# Patient Record
Sex: Female | Born: 1940 | Race: White | Hispanic: No | Smoking: Former smoker
Health system: Southern US, Community
[De-identification: ages and names within clinical notes are randomized; demographics above are authoritative.]

## PROBLEM LIST (undated history)

## (undated) DIAGNOSIS — R011 Cardiac murmur, unspecified: Secondary | ICD-10-CM

## (undated) DIAGNOSIS — I890 Lymphedema, not elsewhere classified: Secondary | ICD-10-CM

## (undated) DIAGNOSIS — C801 Malignant (primary) neoplasm, unspecified: Secondary | ICD-10-CM

## (undated) DIAGNOSIS — I509 Heart failure, unspecified: Secondary | ICD-10-CM

## (undated) DIAGNOSIS — I10 Essential (primary) hypertension: Secondary | ICD-10-CM

## (undated) DIAGNOSIS — K219 Gastro-esophageal reflux disease without esophagitis: Secondary | ICD-10-CM

## (undated) DIAGNOSIS — E119 Type 2 diabetes mellitus without complications: Secondary | ICD-10-CM

## (undated) DIAGNOSIS — M1990 Unspecified osteoarthritis, unspecified site: Secondary | ICD-10-CM

## (undated) HISTORY — DX: Malignant (primary) neoplasm, unspecified: C80.1

## (undated) HISTORY — DX: Cardiac murmur, unspecified: R01.1

## (undated) HISTORY — PX: OTHER SURGICAL HISTORY: SHX169

## (undated) HISTORY — DX: Gastro-esophageal reflux disease without esophagitis: K21.9

## (undated) HISTORY — DX: Unspecified osteoarthritis, unspecified site: M19.90

## (undated) HISTORY — DX: Essential (primary) hypertension: I10

## (undated) HISTORY — DX: Heart failure, unspecified: I50.9

## (undated) HISTORY — DX: Lymphedema, not elsewhere classified: I89.0

## (undated) HISTORY — DX: Type 2 diabetes mellitus without complications: E11.9

## (undated) HISTORY — PX: TONSILLECTOMY: SUR1361

---

## 1996-01-02 DIAGNOSIS — I890 Lymphedema, not elsewhere classified: Secondary | ICD-10-CM

## 1996-01-02 HISTORY — PX: BREAST LUMPECTOMY: SHX2

## 1996-01-02 HISTORY — DX: Lymphedema, not elsewhere classified: I89.0

## 2003-10-14 ENCOUNTER — Ambulatory Visit: Payer: Self-pay | Admitting: Oncology

## 2003-11-02 ENCOUNTER — Ambulatory Visit: Payer: Self-pay | Admitting: Oncology

## 2003-11-30 ENCOUNTER — Inpatient Hospital Stay: Payer: Self-pay | Admitting: Internal Medicine

## 2003-11-30 ENCOUNTER — Other Ambulatory Visit: Payer: Self-pay

## 2003-12-01 ENCOUNTER — Other Ambulatory Visit: Payer: Self-pay

## 2003-12-02 ENCOUNTER — Ambulatory Visit: Payer: Self-pay | Admitting: Oncology

## 2004-01-02 HISTORY — PX: COLONOSCOPY: SHX174

## 2004-02-14 ENCOUNTER — Ambulatory Visit: Payer: Self-pay

## 2004-03-01 ENCOUNTER — Ambulatory Visit: Payer: Self-pay

## 2004-03-22 ENCOUNTER — Inpatient Hospital Stay: Payer: Self-pay

## 2004-04-01 ENCOUNTER — Ambulatory Visit: Payer: Self-pay

## 2004-04-06 ENCOUNTER — Ambulatory Visit: Payer: Self-pay | Admitting: Oncology

## 2004-05-01 ENCOUNTER — Ambulatory Visit: Payer: Self-pay | Admitting: Oncology

## 2004-05-01 ENCOUNTER — Ambulatory Visit: Payer: Self-pay

## 2004-06-01 ENCOUNTER — Ambulatory Visit: Payer: Self-pay

## 2004-06-01 ENCOUNTER — Ambulatory Visit: Payer: Self-pay | Admitting: Oncology

## 2004-06-05 ENCOUNTER — Ambulatory Visit: Payer: Self-pay | Admitting: General Surgery

## 2004-07-01 ENCOUNTER — Ambulatory Visit: Payer: Self-pay

## 2004-08-11 ENCOUNTER — Emergency Department: Payer: Self-pay | Admitting: Emergency Medicine

## 2004-08-16 ENCOUNTER — Ambulatory Visit: Payer: Self-pay

## 2004-10-04 ENCOUNTER — Ambulatory Visit: Payer: Self-pay | Admitting: Oncology

## 2004-11-01 ENCOUNTER — Ambulatory Visit: Payer: Self-pay | Admitting: Oncology

## 2005-01-04 ENCOUNTER — Other Ambulatory Visit: Payer: Self-pay

## 2005-01-04 ENCOUNTER — Emergency Department: Payer: Self-pay | Admitting: Emergency Medicine

## 2005-02-19 ENCOUNTER — Ambulatory Visit: Payer: Self-pay | Admitting: *Deleted

## 2005-03-12 ENCOUNTER — Ambulatory Visit: Payer: Self-pay | Admitting: *Deleted

## 2005-04-02 ENCOUNTER — Ambulatory Visit: Payer: Self-pay | Admitting: Oncology

## 2005-05-01 ENCOUNTER — Ambulatory Visit: Payer: Self-pay | Admitting: Oncology

## 2005-06-04 ENCOUNTER — Ambulatory Visit: Payer: Self-pay

## 2005-06-06 ENCOUNTER — Ambulatory Visit: Payer: Self-pay | Admitting: General Surgery

## 2005-06-12 ENCOUNTER — Ambulatory Visit: Payer: Self-pay | Admitting: Family Medicine

## 2005-07-01 ENCOUNTER — Ambulatory Visit: Payer: Self-pay | Admitting: Family Medicine

## 2005-07-07 ENCOUNTER — Emergency Department: Payer: Self-pay | Admitting: Emergency Medicine

## 2005-07-16 ENCOUNTER — Ambulatory Visit: Payer: Self-pay | Admitting: Nephrology

## 2005-09-14 ENCOUNTER — Ambulatory Visit: Payer: Self-pay | Admitting: *Deleted

## 2005-09-25 ENCOUNTER — Ambulatory Visit: Payer: Self-pay | Admitting: *Deleted

## 2005-10-17 ENCOUNTER — Ambulatory Visit: Payer: Self-pay | Admitting: Oncology

## 2005-11-01 ENCOUNTER — Ambulatory Visit: Payer: Self-pay | Admitting: Oncology

## 2006-02-04 ENCOUNTER — Ambulatory Visit: Payer: Self-pay | Admitting: Unknown Physician Specialty

## 2006-04-02 ENCOUNTER — Ambulatory Visit: Payer: Self-pay | Admitting: Oncology

## 2006-04-23 ENCOUNTER — Ambulatory Visit: Payer: Self-pay | Admitting: Oncology

## 2006-05-02 ENCOUNTER — Ambulatory Visit: Payer: Self-pay | Admitting: Oncology

## 2006-06-10 ENCOUNTER — Ambulatory Visit: Payer: Self-pay | Admitting: General Surgery

## 2006-10-02 ENCOUNTER — Ambulatory Visit: Payer: Self-pay | Admitting: Oncology

## 2006-10-22 ENCOUNTER — Ambulatory Visit: Payer: Self-pay | Admitting: Oncology

## 2006-11-02 ENCOUNTER — Ambulatory Visit: Payer: Self-pay | Admitting: Oncology

## 2006-11-18 ENCOUNTER — Ambulatory Visit: Payer: Self-pay | Admitting: Family Medicine

## 2007-04-02 ENCOUNTER — Ambulatory Visit: Payer: Self-pay | Admitting: Oncology

## 2007-04-17 ENCOUNTER — Ambulatory Visit: Payer: Self-pay | Admitting: Oncology

## 2007-05-02 ENCOUNTER — Ambulatory Visit: Payer: Self-pay | Admitting: Oncology

## 2007-06-04 ENCOUNTER — Ambulatory Visit: Payer: Self-pay | Admitting: General Surgery

## 2007-07-02 ENCOUNTER — Ambulatory Visit: Payer: Self-pay | Admitting: Oncology

## 2007-07-21 ENCOUNTER — Ambulatory Visit: Payer: Self-pay | Admitting: Rheumatology

## 2007-08-13 ENCOUNTER — Encounter: Payer: Self-pay | Admitting: Family Medicine

## 2007-09-02 ENCOUNTER — Encounter: Payer: Self-pay | Admitting: Family Medicine

## 2007-10-02 ENCOUNTER — Ambulatory Visit: Payer: Self-pay | Admitting: Oncology

## 2007-11-02 ENCOUNTER — Ambulatory Visit: Payer: Self-pay | Admitting: Oncology

## 2007-11-25 ENCOUNTER — Ambulatory Visit: Payer: Self-pay | Admitting: Family Medicine

## 2008-02-15 ENCOUNTER — Inpatient Hospital Stay: Payer: Self-pay | Admitting: Internal Medicine

## 2008-03-08 ENCOUNTER — Ambulatory Visit: Payer: Self-pay | Admitting: Internal Medicine

## 2008-06-04 ENCOUNTER — Ambulatory Visit: Payer: Self-pay | Admitting: General Surgery

## 2008-10-01 ENCOUNTER — Ambulatory Visit: Payer: Self-pay | Admitting: Oncology

## 2008-10-15 ENCOUNTER — Ambulatory Visit: Payer: Self-pay | Admitting: Oncology

## 2008-11-01 ENCOUNTER — Ambulatory Visit: Payer: Self-pay | Admitting: Oncology

## 2009-01-16 ENCOUNTER — Ambulatory Visit: Payer: Self-pay | Admitting: Internal Medicine

## 2009-06-07 ENCOUNTER — Ambulatory Visit: Payer: Self-pay | Admitting: General Surgery

## 2009-07-29 ENCOUNTER — Ambulatory Visit: Payer: Self-pay | Admitting: Family Medicine

## 2009-09-15 ENCOUNTER — Ambulatory Visit: Payer: Self-pay | Admitting: Internal Medicine

## 2009-10-06 ENCOUNTER — Inpatient Hospital Stay: Payer: Self-pay | Admitting: Internal Medicine

## 2009-10-19 ENCOUNTER — Encounter: Payer: Self-pay | Admitting: Specialist

## 2009-10-23 ENCOUNTER — Inpatient Hospital Stay: Payer: Self-pay | Admitting: Internal Medicine

## 2009-10-26 ENCOUNTER — Ambulatory Visit: Payer: Self-pay | Admitting: Family

## 2009-10-31 ENCOUNTER — Encounter: Payer: Self-pay | Admitting: Specialist

## 2009-11-01 ENCOUNTER — Encounter: Payer: Self-pay | Admitting: Specialist

## 2009-11-02 ENCOUNTER — Ambulatory Visit: Payer: Self-pay | Admitting: Family Medicine

## 2009-11-03 ENCOUNTER — Emergency Department: Payer: Self-pay | Admitting: Emergency Medicine

## 2009-11-11 ENCOUNTER — Ambulatory Visit: Payer: Self-pay | Admitting: Oncology

## 2009-11-17 ENCOUNTER — Ambulatory Visit: Payer: Self-pay | Admitting: Family Medicine

## 2009-12-01 ENCOUNTER — Ambulatory Visit: Payer: Self-pay | Admitting: Oncology

## 2009-12-06 ENCOUNTER — Ambulatory Visit: Payer: Self-pay | Admitting: Family Medicine

## 2010-02-11 ENCOUNTER — Ambulatory Visit: Payer: Self-pay | Admitting: Internal Medicine

## 2010-02-14 ENCOUNTER — Inpatient Hospital Stay: Payer: Self-pay | Admitting: Internal Medicine

## 2010-02-23 ENCOUNTER — Encounter: Payer: Self-pay | Admitting: Internal Medicine

## 2010-03-02 ENCOUNTER — Encounter: Payer: Self-pay | Admitting: Internal Medicine

## 2010-03-02 ENCOUNTER — Ambulatory Visit: Payer: Self-pay | Admitting: Oncology

## 2010-03-02 ENCOUNTER — Inpatient Hospital Stay: Payer: Self-pay | Admitting: Internal Medicine

## 2010-03-06 LAB — CEA: CEA: 2.5 ng/mL (ref 0.0–4.7)

## 2010-03-06 LAB — CA 125: CA 125: 200.6 U/mL — ABNORMAL HIGH (ref 0.0–34.0)

## 2010-03-22 ENCOUNTER — Ambulatory Visit: Payer: Self-pay | Admitting: Oncology

## 2010-04-02 ENCOUNTER — Ambulatory Visit: Payer: Self-pay | Admitting: Oncology

## 2010-04-02 ENCOUNTER — Encounter: Payer: Self-pay | Admitting: Internal Medicine

## 2010-04-17 ENCOUNTER — Ambulatory Visit: Payer: Self-pay | Admitting: Family Medicine

## 2010-04-19 ENCOUNTER — Ambulatory Visit: Payer: Self-pay | Admitting: Oncology

## 2010-05-02 ENCOUNTER — Encounter: Payer: Self-pay | Admitting: Internal Medicine

## 2010-05-03 ENCOUNTER — Ambulatory Visit: Payer: Self-pay | Admitting: Oncology

## 2010-05-22 ENCOUNTER — Inpatient Hospital Stay: Payer: Self-pay | Admitting: Internal Medicine

## 2010-06-02 ENCOUNTER — Ambulatory Visit: Payer: Self-pay | Admitting: Oncology

## 2010-06-02 ENCOUNTER — Encounter: Payer: Self-pay | Admitting: Internal Medicine

## 2010-06-13 ENCOUNTER — Ambulatory Visit: Payer: Self-pay | Admitting: General Surgery

## 2010-07-02 ENCOUNTER — Ambulatory Visit: Payer: Self-pay | Admitting: Oncology

## 2010-07-02 ENCOUNTER — Encounter: Payer: Self-pay | Admitting: Internal Medicine

## 2010-08-02 ENCOUNTER — Encounter: Payer: Self-pay | Admitting: Internal Medicine

## 2010-08-02 ENCOUNTER — Ambulatory Visit: Payer: Self-pay | Admitting: Oncology

## 2010-08-08 ENCOUNTER — Ambulatory Visit: Payer: Self-pay | Admitting: Gynecologic Oncology

## 2010-08-29 ENCOUNTER — Ambulatory Visit: Payer: Self-pay | Admitting: Internal Medicine

## 2010-09-02 ENCOUNTER — Ambulatory Visit: Payer: Self-pay | Admitting: Oncology

## 2010-09-18 ENCOUNTER — Ambulatory Visit: Payer: Self-pay | Admitting: Family Medicine

## 2010-09-19 ENCOUNTER — Ambulatory Visit: Payer: Self-pay | Admitting: Gynecologic Oncology

## 2010-09-26 ENCOUNTER — Ambulatory Visit: Payer: Self-pay | Admitting: Gynecologic Oncology

## 2010-09-28 LAB — PATHOLOGY REPORT

## 2010-10-02 ENCOUNTER — Ambulatory Visit: Payer: Self-pay | Admitting: Oncology

## 2010-10-02 ENCOUNTER — Ambulatory Visit: Payer: Self-pay | Admitting: Family Medicine

## 2010-11-02 ENCOUNTER — Ambulatory Visit: Payer: Self-pay | Admitting: Family Medicine

## 2010-11-02 ENCOUNTER — Ambulatory Visit: Payer: Self-pay | Admitting: Oncology

## 2010-12-02 ENCOUNTER — Ambulatory Visit: Payer: Self-pay | Admitting: Family Medicine

## 2010-12-15 ENCOUNTER — Ambulatory Visit: Payer: Self-pay | Admitting: Oncology

## 2011-01-02 ENCOUNTER — Ambulatory Visit: Payer: Self-pay | Admitting: Oncology

## 2011-01-02 DIAGNOSIS — I509 Heart failure, unspecified: Secondary | ICD-10-CM

## 2011-01-02 HISTORY — DX: Heart failure, unspecified: I50.9

## 2011-01-02 HISTORY — PX: DILATION AND CURETTAGE OF UTERUS: SHX78

## 2011-06-14 ENCOUNTER — Ambulatory Visit: Payer: Self-pay | Admitting: General Surgery

## 2011-08-22 ENCOUNTER — Ambulatory Visit: Payer: Self-pay | Admitting: Ophthalmology

## 2011-10-24 ENCOUNTER — Ambulatory Visit: Payer: Self-pay | Admitting: Gastroenterology

## 2012-05-14 ENCOUNTER — Ambulatory Visit: Payer: Self-pay | Admitting: Family Medicine

## 2012-06-16 ENCOUNTER — Encounter: Payer: Self-pay | Admitting: General Surgery

## 2012-06-16 ENCOUNTER — Ambulatory Visit: Payer: Self-pay | Admitting: General Surgery

## 2012-07-03 ENCOUNTER — Encounter: Payer: Self-pay | Admitting: General Surgery

## 2012-07-03 ENCOUNTER — Ambulatory Visit (INDEPENDENT_AMBULATORY_CARE_PROVIDER_SITE_OTHER): Payer: Medicare Other | Admitting: General Surgery

## 2012-07-03 VITALS — BP 120/62 | HR 70 | Resp 14 | Ht 65.0 in | Wt 128.0 lb

## 2012-07-03 DIAGNOSIS — I890 Lymphedema, not elsewhere classified: Secondary | ICD-10-CM | POA: Insufficient documentation

## 2012-07-03 DIAGNOSIS — Z853 Personal history of malignant neoplasm of breast: Secondary | ICD-10-CM | POA: Insufficient documentation

## 2012-07-03 NOTE — Progress Notes (Signed)
Patient ID: Patricia Mccoy, female   DOB: 07/21/1940, 72 y.o.   MRN: [id number]  Chief Complaint  Patient presents with  . Follow-up    mammogram    HPI Patricia Mccoy is a 72 y.o. female.  who presents for a breast follow up with known history of breast cancer 1998. The most recent mammogram was done on 06-16-12.  Patient does perform regular self breast checks and gets regular mammograms done.  Wearing lymphedema sleeve to right arm.  HPI  Past Medical History  Diagnosis Date  . Hypertension   . Heart murmur   . Arthritis   . GERD (gastroesophageal reflux disease)   . Diabetes mellitus without complication     IDDM  . CHF (congestive heart failure) 2013  . Lymphedema of arm 1998    R arm  . Cancer     Right Breast    Past Surgical History  Procedure Laterality Date  . Other surgical history  1985, 1990, 2008    Ear Surgery  . Breast lumpectomy Right 1998    axillary dissection  . Tonsillectomy    . Colonoscopy  2006  . Dilation and curettage of uterus  2013    History reviewed. No pertinent family history.  Social History History  Substance Use Topics  . Smoking status: Former Games developer  . Smokeless tobacco: Never Used  . Alcohol Use: No    Allergies  Allergen Reactions  . Ambien (Zolpidem Tartrate)   . Codeine     Hallucinations   . Ivp Dye (Iodinated Diagnostic Agents)   . Keflex (Cephalexin)   . Maxzide (Hydrochlorothiazide W-Triamterene)   . Morphine And Related   . Penicillins   . Zestril (Lisinopril)   . Sulfa Antibiotics Rash    Current Outpatient Prescriptions  Medication Sig Dispense Refill  . amLODipine (NORVASC) 5 MG tablet 5 mg daily.       Marland Kitchen aspirin 81 MG tablet Take 81 mg by mouth daily.      Marland Kitchen atorvastatin (LIPITOR) 40 MG tablet Take 40 mg by mouth daily.       . digoxin (LANOXIN) 0.125 MG tablet Take 0.125 mg by mouth daily.       . ferrous sulfate 325 (65 FE) MG tablet Take 325 mg by mouth daily with breakfast.      .  furosemide (LASIX) 40 MG tablet 40 mg 2 (two) times daily.       Marland Kitchen LANTUS SOLOSTAR 100 UNIT/ML SOPN       . LINZESS 290 MCG CAPS       . losartan (COZAAR) 100 MG tablet Take 100 mg by mouth daily.       Marland Kitchen LOVAZA 1 G capsule Take 1 g by mouth 2 (two) times daily.       . methotrexate (RHEUMATREX) 2.5 MG tablet Take 2.5 mg by mouth 2 (two) times a week.       . metoprolol succinate (TOPROL-XL) 100 MG 24 hr tablet       . mirtazapine (REMERON) 15 MG tablet Take 15 mg by mouth at bedtime.       Marland Kitchen omeprazole (PRILOSEC) 20 MG capsule Take 20 mg by mouth daily.       . predniSONE (DELTASONE) 1 MG tablet Take 1 mg by mouth 2 (two) times daily.       Marland Kitchen telmisartan (MICARDIS) 80 MG tablet Take 40 mg by mouth daily.        No current facility-administered medications for  this visit.    Review of Systems Review of Systems  Constitutional: Negative.   Respiratory: Negative.   Cardiovascular: Negative.     Blood pressure 120/62, pulse 70, resp. rate 14, height 5\' 5"  (1.651 m), weight 128 lb (58.06 kg).  Physical Exam Physical Exam  Constitutional: She is oriented to person, place, and time. She appears well-developed and well-nourished.  Eyes: Conjunctivae are normal.  Neck: No mass and no thyromegaly present.  Cardiovascular: Normal rate and regular rhythm.   Pulses:      Radial pulses are 3+ on the right side, and 3+ on the left side.       Dorsalis pedis pulses are 0 on the right side, and 2+ on the left side.       Posterior tibial pulses are 1+ on the right side, and 1+ on the left side.  1 + mild edema bilateral ankles  Pulmonary/Chest: Effort normal and breath sounds normal. Right breast exhibits no inverted nipple, no mass, no nipple discharge, no skin change and no tenderness. Left breast exhibits no inverted nipple, no mass, no nipple discharge, no skin change and no tenderness.  Musculoskeletal:       Right wrist: She exhibits swelling.  Right arm lymphedema present   Lymphadenopathy:    She has no cervical adenopathy.    She has no axillary adenopathy.  Neurological: She is alert and oriented to person, place, and time.  Skin: Skin is warm and dry.    Data Reviewed Mammogram reviewed-stable  Assessment    Stable exam. Lymphedema right arm under control with compression sleeve.     Plan    1 yr f/u        [facility] G 07/03/2012, 1:02 PM

## 2012-07-03 NOTE — Patient Instructions (Addendum)
Continue self breast exams. Call office for any new breast issues or concerns. Monitor edema in lower legs, follow up with Dr Shelbie Ammons as scheduled

## 2012-08-22 ENCOUNTER — Ambulatory Visit: Payer: Self-pay | Admitting: Family Medicine

## 2013-03-30 ENCOUNTER — Ambulatory Visit: Payer: Self-pay | Admitting: Family Medicine

## 2013-07-14 ENCOUNTER — Encounter: Payer: Self-pay | Admitting: General Surgery

## 2013-07-22 ENCOUNTER — Ambulatory Visit: Payer: Medicare Other | Admitting: General Surgery

## 2013-07-23 ENCOUNTER — Telehealth: Payer: Self-pay | Admitting: General Surgery

## 2013-07-23 NOTE — Telephone Encounter (Signed)
07-23-13 @ 1:59PM L/M FOR PT TO CALL & LET UP KNOW IF SHE CAN COME IN ON MON 07-27-13 @ 9:30AM. SHE CURRENTLY HAS AN APPT FOR 07-27-13 @ 2:30PM/MTH

## 2013-07-27 ENCOUNTER — Ambulatory Visit (INDEPENDENT_AMBULATORY_CARE_PROVIDER_SITE_OTHER): Payer: Medicare HMO | Admitting: General Surgery

## 2013-07-27 ENCOUNTER — Encounter: Payer: Self-pay | Admitting: General Surgery

## 2013-07-27 VITALS — BP 120/72 | HR 70 | Resp 14 | Ht 65.0 in | Wt 118.0 lb

## 2013-07-27 DIAGNOSIS — Z853 Personal history of malignant neoplasm of breast: Secondary | ICD-10-CM

## 2013-07-27 NOTE — Progress Notes (Signed)
Patient ID: Patricia Mccoy, female   DOB: Dec 09, 1940, 73 y.o.   MRN: [id number]  Chief Complaint  Patient presents with  . Follow-up    mammogram    HPI Patricia Mccoy is a 73 y.o. female who presents for a breast evaluation. The most recent mammogram was done on 07/10/13 at [facility].   Patient does perform regular self breast checks and gets regular mammograms done.    HPI  Past Medical History  Diagnosis Date  . Hypertension   . Heart murmur   . Arthritis   . GERD (gastroesophageal reflux disease)   . Diabetes mellitus without complication     IDDM  . CHF (congestive heart failure) 2013  . Lymphedema of arm 1998    R arm  . Cancer     Right Breast    Past Surgical History  Procedure Laterality Date  . Other surgical history  1985, 1990, 2008    Ear Surgery  . Breast lumpectomy Right 1998    axillary dissection  . Tonsillectomy    . Colonoscopy  2006  . Dilation and curettage of uterus  2013    Family History  Problem Relation Age of Onset  . Brain cancer Brother     2 brothers    Social History History  Substance Use Topics  . Smoking status: Former Research scientist (life sciences)  . Smokeless tobacco: Never Used  . Alcohol Use: No    Allergies  Allergen Reactions  . Penicillins Anaphylaxis    Other reaction(s): UNKNOWN  . Ace Inhibitors     Other reaction(s): UNKNOWN  . Ambien [Zolpidem Tartrate]   . Codeine Nausea And Vomiting    Other reaction(s): NAUSEA Hallucinations   . Dexamethasone     Other reaction(s): UNKNOWN  . Ivp Dye [Iodinated Diagnostic Agents]   . Keflex [Cephalexin] Nausea And Vomiting    Other reaction(s): UNKNOWN  . Maxzide [Hydrochlorothiazide W-Triamterene]   . Morphine     Other reaction(s): OTHER  . Morphine And Related   . Zestril [Lisinopril] Nausea And Vomiting  . Zolpidem     Other reaction(s): OTHER  . Sulfa Antibiotics Rash    Other reaction(s): UNKNOWN    Current Outpatient Prescriptions  Medication Sig Dispense Refill  .  amLODipine (NORVASC) 5 MG tablet 5 mg daily.       Marland Kitchen aspirin 81 MG tablet Take 81 mg by mouth daily.      Marland Kitchen atorvastatin (LIPITOR) 40 MG tablet Take 40 mg by mouth daily.       . digoxin (LANOXIN) 0.125 MG tablet Take 0.125 mg by mouth daily.       . ferrous sulfate 325 (65 FE) MG tablet Take 325 mg by mouth daily with breakfast.      . Fish Oil-Cholecalciferol (FISH OIL + D3 PO) Take 1 tablet by mouth.      . furosemide (LASIX) 40 MG tablet 40 mg 2 (two) times daily.       . insulin aspart (NOVOLOG) 100 UNIT/ML injection Inject into the skin 3 (three) times daily before meals.      Marland Kitchen LANTUS SOLOSTAR 100 UNIT/ML SOPN       . LINZESS 290 MCG CAPS       . losartan (COZAAR) 100 MG tablet Take 100 mg by mouth daily.       Marland Kitchen LOVAZA 1 G capsule Take 1 g by mouth 2 (two) times daily.       . methotrexate (RHEUMATREX) 2.5 MG tablet Take  2.5 mg by mouth 2 (two) times a week.       . metoprolol succinate (TOPROL-XL) 100 MG 24 hr tablet       . mirtazapine (REMERON) 15 MG tablet Take 15 mg by mouth at bedtime.       Marland Kitchen omeprazole (PRILOSEC) 20 MG capsule Take 20 mg by mouth daily.       . predniSONE (DELTASONE) 1 MG tablet Take 1 mg by mouth 2 (two) times daily.       Marland Kitchen telmisartan (MICARDIS) 80 MG tablet Take 40 mg by mouth daily.        No current facility-administered medications for this visit.    Review of Systems Review of Systems  Constitutional: Negative.   Respiratory: Negative.   Cardiovascular: Negative.     Blood pressure 120/72, pulse 70, resp. rate 14, height 5\' 5"  (1.651 m), weight 118 lb (53.524 kg).  Physical Exam Physical Exam  Constitutional: She is oriented to person, place, and time. She appears well-developed and well-nourished.  Eyes: Conjunctivae are normal. No scleral icterus.  Neck: Neck supple.  Cardiovascular: Normal rate and regular rhythm.   Murmur heard.  Systolic murmur is present with a grade of 4/6  Murmur is best heard over the precordium    Pulmonary/Chest: Effort normal and breath sounds normal. Right breast exhibits no inverted nipple, no mass, no nipple discharge, no skin change and no tenderness. Left breast exhibits no inverted nipple, no mass, no nipple discharge, no skin change and no tenderness.  Abdominal: Soft. Bowel sounds are normal. There is no tenderness.  Lymphadenopathy:    She has no cervical adenopathy.    She has no axillary adenopathy.  Neurological: She is alert and oriented to person, place, and time.  Skin: Skin is warm and dry.  Right arm lymphedema present and stable  Data Reviewed Mammogram reviewed. Stable  Assessment    Stable exam. Lymphedema right arm under control with compression sleeve. History of breast cancer. No signs of recurrence       Plan    Patient to return in one year bilateral screening mammogram.         Junie Panning G 07/27/2013, 12:46 PM

## 2013-07-27 NOTE — Patient Instructions (Addendum)
Patient to return in one year bilateral screening mammogram.  Continue self breast exams. Call office for any new breast issues or concerns.

## 2013-08-14 ENCOUNTER — Ambulatory Visit: Payer: Self-pay | Admitting: Gastroenterology

## 2013-08-19 LAB — PATHOLOGY REPORT

## 2013-08-23 ENCOUNTER — Ambulatory Visit: Payer: Self-pay

## 2013-08-30 ENCOUNTER — Inpatient Hospital Stay: Payer: Self-pay | Admitting: Internal Medicine

## 2013-08-30 LAB — CBC WITH DIFFERENTIAL/PLATELET
Basophil #: 0.2 10*3/uL — ABNORMAL HIGH (ref 0.0–0.1)
Basophil %: 2.4 %
EOS ABS: 0.3 10*3/uL (ref 0.0–0.7)
EOS PCT: 3.4 %
HCT: 40 % (ref 35.0–47.0)
HGB: 12.9 g/dL (ref 12.0–16.0)
Lymphocyte #: 1 10*3/uL (ref 1.0–3.6)
Lymphocyte %: 11.4 %
MCH: 31.7 pg (ref 26.0–34.0)
MCHC: 32.3 g/dL (ref 32.0–36.0)
MCV: 98 fL (ref 80–100)
MONO ABS: 0.5 x10 3/mm (ref 0.2–0.9)
MONOS PCT: 5.8 %
NEUTROS PCT: 77 %
Neutrophil #: 6.6 10*3/uL — ABNORMAL HIGH (ref 1.4–6.5)
PLATELETS: 344 10*3/uL (ref 150–440)
RBC: 4.07 10*6/uL (ref 3.80–5.20)
RDW: 14.8 % — ABNORMAL HIGH (ref 11.5–14.5)
WBC: 8.5 10*3/uL (ref 3.6–11.0)

## 2013-08-30 LAB — COMPREHENSIVE METABOLIC PANEL
ALK PHOS: 179 U/L — AB
ALT: 23 U/L
Albumin: 3.5 g/dL (ref 3.4–5.0)
Anion Gap: 9 (ref 7–16)
BILIRUBIN TOTAL: 0.6 mg/dL (ref 0.2–1.0)
BUN: 27 mg/dL — AB (ref 7–18)
CHLORIDE: 98 mmol/L (ref 98–107)
CO2: 29 mmol/L (ref 21–32)
Calcium, Total: 9.3 mg/dL (ref 8.5–10.1)
Creatinine: 1.98 mg/dL — ABNORMAL HIGH (ref 0.60–1.30)
EGFR (African American): 28 — ABNORMAL LOW
GFR CALC NON AF AMER: 24 — AB
Glucose: 224 mg/dL — ABNORMAL HIGH (ref 65–99)
OSMOLALITY: 284 (ref 275–301)
Potassium: 3.7 mmol/L (ref 3.5–5.1)
SGOT(AST): 37 U/L (ref 15–37)
SODIUM: 136 mmol/L (ref 136–145)
Total Protein: 7.9 g/dL (ref 6.4–8.2)

## 2013-08-30 LAB — CK-MB
CK-MB: 2.4 ng/mL (ref 0.5–3.6)
CK-MB: 2.1 ng/mL (ref 0.5–3.6)
CK-MB: 1.8 ng/mL (ref 0.5–3.6)

## 2013-08-30 LAB — TROPONIN I
TROPONIN-I: 1.7 ng/mL — AB
Troponin-I: 2 ng/mL — ABNORMAL HIGH
Troponin-I: 1.4 ng/mL — ABNORMAL HIGH

## 2013-08-30 LAB — PRO B NATRIURETIC PEPTIDE: B-Type Natriuretic Peptide: 16918 pg/mL — ABNORMAL HIGH (ref 0–125)

## 2013-08-31 LAB — CBC WITH DIFFERENTIAL/PLATELET
Basophil #: 0 10*3/uL (ref 0.0–0.1)
Basophil %: 0.6 %
Eosinophil #: 0.2 10*3/uL (ref 0.0–0.7)
Eosinophil %: 4.6 %
HCT: 30.7 % — ABNORMAL LOW (ref 35.0–47.0)
HGB: 10 g/dL — AB (ref 12.0–16.0)
Lymphocyte #: 1.4 10*3/uL (ref 1.0–3.6)
Lymphocyte %: 26.9 %
MCH: 31.6 pg (ref 26.0–34.0)
MCHC: 32.6 g/dL (ref 32.0–36.0)
MCV: 97 fL (ref 80–100)
Monocyte #: 0.2 x10 3/mm (ref 0.2–0.9)
Monocyte %: 3.6 %
NEUTROS ABS: 3.4 10*3/uL (ref 1.4–6.5)
NEUTROS PCT: 64.3 %
PLATELETS: 252 10*3/uL (ref 150–440)
RBC: 3.17 10*6/uL — AB (ref 3.80–5.20)
RDW: 14.7 % — AB (ref 11.5–14.5)
WBC: 5.2 10*3/uL (ref 3.6–11.0)

## 2013-08-31 LAB — COMPREHENSIVE METABOLIC PANEL
ALBUMIN: 2.4 g/dL — AB (ref 3.4–5.0)
ALK PHOS: 106 U/L
Anion Gap: 9 (ref 7–16)
BUN: 39 mg/dL — ABNORMAL HIGH (ref 7–18)
Bilirubin,Total: 0.6 mg/dL (ref 0.2–1.0)
CHLORIDE: 99 mmol/L (ref 98–107)
CREATININE: 1.98 mg/dL — AB (ref 0.60–1.30)
Calcium, Total: 8.3 mg/dL — ABNORMAL LOW (ref 8.5–10.1)
Co2: 30 mmol/L (ref 21–32)
EGFR (Non-African Amer.): 24 — ABNORMAL LOW
GFR CALC AF AMER: 28 — AB
Glucose: 151 mg/dL — ABNORMAL HIGH (ref 65–99)
OSMOLALITY: 288 (ref 275–301)
Potassium: 3.7 mmol/L (ref 3.5–5.1)
SGOT(AST): 23 U/L (ref 15–37)
SGPT (ALT): 16 U/L
Sodium: 138 mmol/L (ref 136–145)
Total Protein: 5.5 g/dL — ABNORMAL LOW (ref 6.4–8.2)

## 2013-08-31 LAB — LIPID PANEL
CHOLESTEROL: 115 mg/dL (ref 0–200)
HDL Cholesterol: 47 mg/dL (ref 40–60)
Ldl Cholesterol, Calc: 46 mg/dL (ref 0–100)
TRIGLYCERIDES: 109 mg/dL (ref 0–200)
VLDL CHOLESTEROL, CALC: 22 mg/dL (ref 5–40)

## 2013-08-31 LAB — PRO B NATRIURETIC PEPTIDE: B-Type Natriuretic Peptide: 23242 pg/mL — ABNORMAL HIGH (ref 0–125)

## 2013-09-05 ENCOUNTER — Observation Stay: Payer: Self-pay | Admitting: Specialist

## 2013-09-05 ENCOUNTER — Ambulatory Visit: Payer: Self-pay | Admitting: Physician Assistant

## 2013-09-05 LAB — CBC WITH DIFFERENTIAL/PLATELET
BASOS PCT: 0.4 %
Basophil #: 0 10*3/uL (ref 0.0–0.1)
Eosinophil #: 0.1 10*3/uL (ref 0.0–0.7)
Eosinophil %: 0.8 %
HCT: 36.8 % (ref 35.0–47.0)
HGB: 11.9 g/dL — AB (ref 12.0–16.0)
Lymphocyte #: 0.6 10*3/uL — ABNORMAL LOW (ref 1.0–3.6)
Lymphocyte %: 5.6 %
MCH: 31.9 pg (ref 26.0–34.0)
MCHC: 32.3 g/dL (ref 32.0–36.0)
MCV: 99 fL (ref 80–100)
Monocyte #: 0.5 x10 3/mm (ref 0.2–0.9)
Monocyte %: 4.7 %
NEUTROS ABS: 9.5 10*3/uL — AB (ref 1.4–6.5)
NEUTROS PCT: 88.5 %
PLATELETS: 303 10*3/uL (ref 150–440)
RBC: 3.73 10*6/uL — AB (ref 3.80–5.20)
RDW: 15.4 % — ABNORMAL HIGH (ref 11.5–14.5)
WBC: 10.8 10*3/uL (ref 3.6–11.0)

## 2013-09-05 LAB — BASIC METABOLIC PANEL
Anion Gap: 9 (ref 7–16)
BUN: 38 mg/dL — AB (ref 7–18)
Calcium, Total: 9.1 mg/dL (ref 8.5–10.1)
Chloride: 94 mmol/L — ABNORMAL LOW (ref 98–107)
Co2: 28 mmol/L (ref 21–32)
Creatinine: 2.32 mg/dL — ABNORMAL HIGH (ref 0.60–1.30)
EGFR (Non-African Amer.): 20 — ABNORMAL LOW
GFR CALC AF AMER: 23 — AB
Glucose: 304 mg/dL — ABNORMAL HIGH (ref 65–99)
OSMOLALITY: 283 (ref 275–301)
Potassium: 4.4 mmol/L (ref 3.5–5.1)
SODIUM: 131 mmol/L — AB (ref 136–145)

## 2013-09-05 LAB — PROTIME-INR
INR: 0.9
PROTHROMBIN TIME: 12.3 s (ref 11.5–14.7)

## 2013-09-06 LAB — CBC WITH DIFFERENTIAL/PLATELET
BASOS ABS: 0 10*3/uL (ref 0.0–0.1)
BASOS PCT: 0.6 %
EOS PCT: 5 %
Eosinophil #: 0.4 10*3/uL (ref 0.0–0.7)
HCT: 36.5 % (ref 35.0–47.0)
HGB: 12.3 g/dL (ref 12.0–16.0)
LYMPHS ABS: 2 10*3/uL (ref 1.0–3.6)
LYMPHS PCT: 23.9 %
MCH: 32.7 pg (ref 26.0–34.0)
MCHC: 33.8 g/dL (ref 32.0–36.0)
MCV: 97 fL (ref 80–100)
MONOS PCT: 6.4 %
Monocyte #: 0.5 x10 3/mm (ref 0.2–0.9)
NEUTROS ABS: 5.4 10*3/uL (ref 1.4–6.5)
Neutrophil %: 64.1 %
PLATELETS: 308 10*3/uL (ref 150–440)
RBC: 3.77 10*6/uL — AB (ref 3.80–5.20)
RDW: 15.3 % — ABNORMAL HIGH (ref 11.5–14.5)
WBC: 8.4 10*3/uL (ref 3.6–11.0)

## 2013-09-06 LAB — BASIC METABOLIC PANEL
Anion Gap: 7 (ref 7–16)
BUN: 38 mg/dL — ABNORMAL HIGH (ref 7–18)
CALCIUM: 8.8 mg/dL (ref 8.5–10.1)
Chloride: 98 mmol/L (ref 98–107)
Co2: 32 mmol/L (ref 21–32)
Creatinine: 2.11 mg/dL — ABNORMAL HIGH (ref 0.60–1.30)
EGFR (African American): 26 — ABNORMAL LOW
GFR CALC NON AF AMER: 22 — AB
Glucose: 45 mg/dL — ABNORMAL LOW (ref 65–99)
Osmolality: 280 (ref 275–301)
Potassium: 3.6 mmol/L (ref 3.5–5.1)
Sodium: 137 mmol/L (ref 136–145)

## 2013-10-01 ENCOUNTER — Inpatient Hospital Stay: Payer: Self-pay | Admitting: Internal Medicine

## 2013-10-01 LAB — COMPREHENSIVE METABOLIC PANEL
ALBUMIN: 3.1 g/dL — AB (ref 3.4–5.0)
ALT: 31 U/L
ANION GAP: 7 (ref 7–16)
AST: 36 U/L (ref 15–37)
Alkaline Phosphatase: 190 U/L — ABNORMAL HIGH
BILIRUBIN TOTAL: 0.5 mg/dL (ref 0.2–1.0)
BUN: 26 mg/dL — ABNORMAL HIGH (ref 7–18)
CHLORIDE: 94 mmol/L — AB (ref 98–107)
CREATININE: 1.85 mg/dL — AB (ref 0.60–1.30)
Calcium, Total: 8.1 mg/dL — ABNORMAL LOW (ref 8.5–10.1)
Co2: 28 mmol/L (ref 21–32)
EGFR (African American): 34 — ABNORMAL LOW
EGFR (Non-African Amer.): 28 — ABNORMAL LOW
Glucose: 341 mg/dL — ABNORMAL HIGH (ref 65–99)
OSMOLALITY: 277 (ref 275–301)
POTASSIUM: 3.3 mmol/L — AB (ref 3.5–5.1)
SODIUM: 129 mmol/L — AB (ref 136–145)
Total Protein: 6.7 g/dL (ref 6.4–8.2)

## 2013-10-01 LAB — CBC WITH DIFFERENTIAL/PLATELET
Basophil #: 0.1 10*3/uL (ref 0.0–0.1)
Basophil %: 0.8 %
Eosinophil #: 0.3 10*3/uL (ref 0.0–0.7)
Eosinophil %: 4.8 %
HCT: 33.5 % — ABNORMAL LOW (ref 35.0–47.0)
HGB: 11.1 g/dL — AB (ref 12.0–16.0)
LYMPHS ABS: 1.3 10*3/uL (ref 1.0–3.6)
LYMPHS PCT: 17.5 %
MCH: 32.3 pg (ref 26.0–34.0)
MCHC: 33.2 g/dL (ref 32.0–36.0)
MCV: 97 fL (ref 80–100)
Monocyte #: 0.5 x10 3/mm (ref 0.2–0.9)
Monocyte %: 7.5 %
NEUTROS ABS: 5.1 10*3/uL (ref 1.4–6.5)
NEUTROS PCT: 69.4 %
Platelet: 309 10*3/uL (ref 150–440)
RBC: 3.45 10*6/uL — ABNORMAL LOW (ref 3.80–5.20)
RDW: 14.5 % (ref 11.5–14.5)
WBC: 7.3 10*3/uL (ref 3.6–11.0)

## 2013-10-01 LAB — URINALYSIS, COMPLETE
BLOOD: NEGATIVE
Bacteria: NONE SEEN
Bilirubin,UR: NEGATIVE
Glucose,UR: >=500 mg/dL (ref 0–75)
Ketone: NEGATIVE
LEUKOCYTE ESTERASE: NEGATIVE
NITRITE: NEGATIVE
PH: 6 (ref 4.5–8.0)
Protein: NEGATIVE
RBC,UR: 1 /HPF (ref 0–5)
Specific Gravity: 1.002 (ref 1.003–1.030)
Squamous Epithelial: <1
WBC UR: <1 /HPF (ref 0–5)

## 2013-10-01 LAB — CK-MB
CK-MB: 2.1 ng/mL (ref 0.5–3.6)
CK-MB: 2 ng/mL (ref 0.5–3.6)

## 2013-10-01 LAB — ALBUMIN, FLUID (OTHER): Body Fluid Albumin: 1.5 g/dL

## 2013-10-01 LAB — AMYLASE, BODY FLUID: AMYLASE, BODY FLUID: 27 U/L

## 2013-10-01 LAB — PROTEIN, BODY FLUID: Protein, Body Fluid: 2.2 g/dL

## 2013-10-01 LAB — TROPONIN I
TROPONIN-I: 0.1 ng/mL — AB
TROPONIN-I: 0.12 ng/mL — AB
TROPONIN-I: 0.09 ng/mL — AB
Troponin-I: 0.03 ng/mL
Troponin-I: 0.12 ng/mL — ABNORMAL HIGH

## 2013-10-01 LAB — LACTATE DEHYDROGENASE, PLEURAL OR PERITONEAL FLUID: LDH, BODY FLUID: 75 U/L

## 2013-10-01 LAB — GLUCOSE, SEROUS FLUID: Glucose, Body Fluid: 175 mg/dL

## 2013-10-01 LAB — CK TOTAL AND CKMB (NOT AT ARMC)
CK, TOTAL: 71 U/L
CK-MB: 1.7 ng/mL (ref 0.5–3.6)

## 2013-10-01 LAB — PRO B NATRIURETIC PEPTIDE: B-Type Natriuretic Peptide: 8203 pg/mL — ABNORMAL HIGH (ref 0–125)

## 2013-10-02 LAB — BASIC METABOLIC PANEL
Anion Gap: 8 (ref 7–16)
BUN: 22 mg/dL — AB (ref 7–18)
CO2: 29 mmol/L (ref 21–32)
Calcium, Total: 8.5 mg/dL (ref 8.5–10.1)
Chloride: 98 mmol/L (ref 98–107)
Creatinine: 1.67 mg/dL — ABNORMAL HIGH (ref 0.60–1.30)
EGFR (African American): 39 — ABNORMAL LOW
EGFR (Non-African Amer.): 32 — ABNORMAL LOW
GLUCOSE: 258 mg/dL — AB (ref 65–99)
Osmolality: 282 (ref 275–301)
POTASSIUM: 3.6 mmol/L (ref 3.5–5.1)
SODIUM: 135 mmol/L — AB (ref 136–145)

## 2013-10-03 LAB — BASIC METABOLIC PANEL
Anion Gap: 9 (ref 7–16)
BUN: 33 mg/dL — AB (ref 7–18)
CALCIUM: 7.7 mg/dL — AB (ref 8.5–10.1)
Chloride: 95 mmol/L — ABNORMAL LOW (ref 98–107)
Co2: 27 mmol/L (ref 21–32)
Creatinine: 1.98 mg/dL — ABNORMAL HIGH (ref 0.60–1.30)
Glucose: 317 mg/dL — ABNORMAL HIGH (ref 65–99)
OSMOLALITY: 282 (ref 275–301)
Potassium: 3.8 mmol/L (ref 3.5–5.1)
Sodium: 131 mmol/L — ABNORMAL LOW (ref 136–145)

## 2013-10-03 LAB — CBC WITH DIFFERENTIAL/PLATELET
Basophil #: 0.1 10*3/uL (ref 0.0–0.1)
Basophil %: 0.8 %
Eosinophil #: 0.2 10*3/uL (ref 0.0–0.7)
Eosinophil %: 3 %
HCT: 29.2 % — ABNORMAL LOW (ref 35.0–47.0)
HGB: 9.8 g/dL — AB (ref 12.0–16.0)
Lymphocyte #: 1.2 10*3/uL (ref 1.0–3.6)
Lymphocyte %: 16.9 %
MCH: 32.6 pg (ref 26.0–34.0)
MCHC: 33.6 g/dL (ref 32.0–36.0)
MCV: 97 fL (ref 80–100)
MONOS PCT: 8.4 %
Monocyte #: 0.6 x10 3/mm (ref 0.2–0.9)
Neutrophil #: 4.9 10*3/uL (ref 1.4–6.5)
Neutrophil %: 70.9 %
PLATELETS: 261 10*3/uL (ref 150–440)
RBC: 3.01 10*6/uL — AB (ref 3.80–5.20)
RDW: 14.5 % (ref 11.5–14.5)
WBC: 6.9 10*3/uL (ref 3.6–11.0)

## 2013-11-02 ENCOUNTER — Encounter: Payer: Self-pay | Admitting: General Surgery

## 2013-11-18 ENCOUNTER — Inpatient Hospital Stay: Payer: Self-pay | Admitting: Internal Medicine

## 2013-11-18 ENCOUNTER — Ambulatory Visit: Payer: Self-pay | Admitting: Family Medicine

## 2013-11-18 LAB — COMPREHENSIVE METABOLIC PANEL
ALT: 27 U/L
Albumin: 3.6 g/dL (ref 3.4–5.0)
Alkaline Phosphatase: 129 U/L — ABNORMAL HIGH
Anion Gap: 9 (ref 7–16)
BILIRUBIN TOTAL: 1 mg/dL (ref 0.2–1.0)
BUN: 34 mg/dL — ABNORMAL HIGH (ref 7–18)
CHLORIDE: 94 mmol/L — AB (ref 98–107)
CO2: 28 mmol/L (ref 21–32)
Calcium, Total: 9.4 mg/dL (ref 8.5–10.1)
Creatinine: 1.88 mg/dL — ABNORMAL HIGH (ref 0.60–1.30)
GLUCOSE: 288 mg/dL — AB (ref 65–99)
Osmolality: 281 (ref 275–301)
Potassium: 3.6 mmol/L (ref 3.5–5.1)
SGOT(AST): 23 U/L (ref 15–37)
Sodium: 131 mmol/L — ABNORMAL LOW (ref 136–145)
Total Protein: 7.2 g/dL (ref 6.4–8.2)

## 2013-11-18 LAB — CBC WITH DIFFERENTIAL/PLATELET
BASOS ABS: 0 10*3/uL (ref 0.0–0.1)
BASOS PCT: 0.3 %
EOS PCT: 1.1 %
Eosinophil #: 0.1 10*3/uL (ref 0.0–0.7)
HCT: 36.4 % (ref 35.0–47.0)
HGB: 11.9 g/dL — AB (ref 12.0–16.0)
LYMPHS PCT: 12 %
Lymphocyte #: 1 10*3/uL (ref 1.0–3.6)
MCH: 31.8 pg (ref 26.0–34.0)
MCHC: 32.7 g/dL (ref 32.0–36.0)
MCV: 97 fL (ref 80–100)
MONOS PCT: 7.3 %
Monocyte #: 0.6 x10 3/mm (ref 0.2–0.9)
NEUTROS ABS: 6.4 10*3/uL (ref 1.4–6.5)
Neutrophil %: 79.3 %
Platelet: 315 10*3/uL (ref 150–440)
RBC: 3.74 10*6/uL — ABNORMAL LOW (ref 3.80–5.20)
RDW: 15.2 % — ABNORMAL HIGH (ref 11.5–14.5)
WBC: 8.1 10*3/uL (ref 3.6–11.0)

## 2013-11-18 LAB — TROPONIN I
TROPONIN-I: 0.02 ng/mL
Troponin-I: 0.02 ng/mL

## 2013-11-18 LAB — CK TOTAL AND CKMB (NOT AT ARMC)
CK, TOTAL: 37 U/L
CK, Total: 48 U/L
CK, Total: 35 U/L
CK-MB: 1.4 ng/mL (ref 0.5–3.6)
CK-MB: 1.4 ng/mL (ref 0.5–3.6)
CK-MB: 0.9 ng/mL (ref 0.5–3.6)

## 2013-11-18 LAB — PROTIME-INR
INR: 0.9
Prothrombin Time: 12.2 secs (ref 11.5–14.7)

## 2013-11-18 LAB — PRO B NATRIURETIC PEPTIDE: B-TYPE NATIURETIC PEPTID: 8382 pg/mL — AB (ref 0–450)

## 2013-11-19 LAB — BASIC METABOLIC PANEL
ANION GAP: 5 — AB (ref 7–16)
BUN: 37 mg/dL — AB (ref 7–18)
CO2: 32 mmol/L (ref 21–32)
CREATININE: 1.89 mg/dL — AB (ref 0.60–1.30)
Calcium, Total: 8 mg/dL — ABNORMAL LOW (ref 8.5–10.1)
Chloride: 100 mmol/L (ref 98–107)
EGFR (African American): 33 — ABNORMAL LOW
GFR CALC NON AF AMER: 28 — AB
GLUCOSE: 150 mg/dL — AB (ref 65–99)
OSMOLALITY: 285 (ref 275–301)
POTASSIUM: 3.2 mmol/L — AB (ref 3.5–5.1)
Sodium: 137 mmol/L (ref 136–145)

## 2013-11-19 LAB — CBC WITH DIFFERENTIAL/PLATELET
Basophil #: 0 10*3/uL (ref 0.0–0.1)
Basophil %: 0.7 %
Eosinophil #: 0.2 10*3/uL (ref 0.0–0.7)
Eosinophil %: 3.7 %
HCT: 30.1 % — AB (ref 35.0–47.0)
HGB: 9.9 g/dL — AB (ref 12.0–16.0)
Lymphocyte #: 1.3 10*3/uL (ref 1.0–3.6)
Lymphocyte %: 22.4 %
MCH: 31.9 pg (ref 26.0–34.0)
MCHC: 32.8 g/dL (ref 32.0–36.0)
MCV: 97 fL (ref 80–100)
MONOS PCT: 9.2 %
Monocyte #: 0.5 x10 3/mm (ref 0.2–0.9)
Neutrophil #: 3.6 10*3/uL (ref 1.4–6.5)
Neutrophil %: 64 %
PLATELETS: 255 10*3/uL (ref 150–440)
RBC: 3.1 10*6/uL — ABNORMAL LOW (ref 3.80–5.20)
RDW: 15.6 % — AB (ref 11.5–14.5)
WBC: 5.6 10*3/uL (ref 3.6–11.0)

## 2013-11-20 LAB — BASIC METABOLIC PANEL
Anion Gap: 6 — ABNORMAL LOW (ref 7–16)
BUN: 43 mg/dL — ABNORMAL HIGH (ref 7–18)
CO2: 31 mmol/L (ref 21–32)
CREATININE: 2 mg/dL — AB (ref 0.60–1.30)
Calcium, Total: 7.9 mg/dL — ABNORMAL LOW (ref 8.5–10.1)
Chloride: 99 mmol/L (ref 98–107)
GFR CALC AF AMER: 31 — AB
GFR CALC NON AF AMER: 26 — AB
Glucose: 189 mg/dL — ABNORMAL HIGH (ref 65–99)
OSMOLALITY: 288 (ref 275–301)
Potassium: 3.7 mmol/L (ref 3.5–5.1)
SODIUM: 136 mmol/L (ref 136–145)

## 2013-12-01 ENCOUNTER — Ambulatory Visit: Payer: Self-pay | Admitting: Internal Medicine

## 2013-12-08 ENCOUNTER — Inpatient Hospital Stay: Payer: Self-pay | Admitting: Internal Medicine

## 2013-12-08 LAB — CBC
HCT: 34.4 % — AB (ref 35.0–47.0)
HGB: 11.1 g/dL — ABNORMAL LOW (ref 12.0–16.0)
MCH: 31.3 pg (ref 26.0–34.0)
MCHC: 32.2 g/dL (ref 32.0–36.0)
MCV: 97 fL (ref 80–100)
Platelet: 279 10*3/uL (ref 150–440)
RBC: 3.53 10*6/uL — ABNORMAL LOW (ref 3.80–5.20)
RDW: 15.5 % — ABNORMAL HIGH (ref 11.5–14.5)
WBC: 10.1 10*3/uL (ref 3.6–11.0)

## 2013-12-08 LAB — BASIC METABOLIC PANEL
Anion Gap: 11 (ref 7–16)
BUN: 86 mg/dL — ABNORMAL HIGH (ref 7–18)
CALCIUM: 8.7 mg/dL (ref 8.5–10.1)
CREATININE: 4.69 mg/dL — AB (ref 0.60–1.30)
Chloride: 89 mmol/L — ABNORMAL LOW (ref 98–107)
Co2: 28 mmol/L (ref 21–32)
GFR CALC AF AMER: 12 — AB
GFR CALC NON AF AMER: 10 — AB
Glucose: 429 mg/dL — ABNORMAL HIGH (ref 65–99)
Osmolality: 302 (ref 275–301)
Potassium: 3.8 mmol/L (ref 3.5–5.1)
Sodium: 128 mmol/L — ABNORMAL LOW (ref 136–145)

## 2013-12-08 LAB — PRO B NATRIURETIC PEPTIDE: B-Type Natriuretic Peptide: 54527 pg/mL — ABNORMAL HIGH (ref 0–450)

## 2013-12-08 LAB — PROTIME-INR
INR: 1
Prothrombin Time: 12.7 secs (ref 11.5–14.7)

## 2013-12-08 LAB — TROPONIN I
Troponin-I: 0.49 ng/mL — ABNORMAL HIGH
Troponin-I: 0.49 ng/mL — ABNORMAL HIGH

## 2013-12-08 LAB — CK-MB
CK-MB: 3.3 ng/mL (ref 0.5–3.6)
CK-MB: 3.1 ng/mL (ref 0.5–3.6)
CK-MB: 2.7 ng/mL (ref 0.5–3.6)

## 2013-12-09 LAB — LIPID PANEL
Cholesterol: 108 mg/dL (ref 0–200)
HDL Cholesterol: 32 mg/dL — ABNORMAL LOW (ref 40–60)
Ldl Cholesterol, Calc: 49 mg/dL (ref 0–100)
Triglycerides: 134 mg/dL (ref 0–200)
VLDL Cholesterol, Calc: 27 mg/dL (ref 5–40)

## 2013-12-09 LAB — BASIC METABOLIC PANEL
Anion Gap: 11 (ref 7–16)
BUN: 87 mg/dL — ABNORMAL HIGH (ref 7–18)
CALCIUM: 8.1 mg/dL — AB (ref 8.5–10.1)
Chloride: 91 mmol/L — ABNORMAL LOW (ref 98–107)
Co2: 27 mmol/L (ref 21–32)
Creatinine: 4.7 mg/dL — ABNORMAL HIGH (ref 0.60–1.30)
EGFR (African American): 12 — ABNORMAL LOW
EGFR (Non-African Amer.): 10 — ABNORMAL LOW
Glucose: 245 mg/dL — ABNORMAL HIGH (ref 65–99)
Osmolality: 294 (ref 275–301)
POTASSIUM: 3.3 mmol/L — AB (ref 3.5–5.1)
SODIUM: 129 mmol/L — AB (ref 136–145)

## 2013-12-09 LAB — CBC WITH DIFFERENTIAL/PLATELET
BASOS PCT: 0.3 %
Basophil #: 0 10*3/uL (ref 0.0–0.1)
EOS PCT: 2.3 %
Eosinophil #: 0.2 10*3/uL (ref 0.0–0.7)
HCT: 29.4 % — ABNORMAL LOW (ref 35.0–47.0)
HGB: 9.6 g/dL — AB (ref 12.0–16.0)
LYMPHS ABS: 0.9 10*3/uL — AB (ref 1.0–3.6)
Lymphocyte %: 11.2 %
MCH: 31.5 pg (ref 26.0–34.0)
MCHC: 32.6 g/dL (ref 32.0–36.0)
MCV: 97 fL (ref 80–100)
MONO ABS: 0.2 x10 3/mm (ref 0.2–0.9)
MONOS PCT: 2.9 %
Neutrophil #: 6.6 10*3/uL — ABNORMAL HIGH (ref 1.4–6.5)
Neutrophil %: 83.3 %
Platelet: 254 10*3/uL (ref 150–440)
RBC: 3.04 10*6/uL — ABNORMAL LOW (ref 3.80–5.20)
RDW: 15.6 % — AB (ref 11.5–14.5)
WBC: 8 10*3/uL (ref 3.6–11.0)

## 2013-12-09 LAB — TROPONIN I: Troponin-I: 0.47 ng/mL — ABNORMAL HIGH

## 2013-12-10 LAB — BASIC METABOLIC PANEL
Anion Gap: 12 (ref 7–16)
BUN: 87 mg/dL — AB (ref 7–18)
CO2: 23 mmol/L (ref 21–32)
CREATININE: 4.56 mg/dL — AB (ref 0.60–1.30)
Calcium, Total: 8.6 mg/dL (ref 8.5–10.1)
Chloride: 93 mmol/L — ABNORMAL LOW (ref 98–107)
EGFR (African American): 12 — ABNORMAL LOW
GFR CALC NON AF AMER: 10 — AB
GLUCOSE: 194 mg/dL — AB (ref 65–99)
OSMOLALITY: 289 (ref 275–301)
Potassium: 3.7 mmol/L (ref 3.5–5.1)
SODIUM: 128 mmol/L — AB (ref 136–145)

## 2013-12-10 LAB — MAGNESIUM: Magnesium: 2.1 mg/dL

## 2013-12-11 LAB — BASIC METABOLIC PANEL
Anion Gap: 12 (ref 7–16)
Anion Gap: 15 (ref 7–16)
BUN: 93 mg/dL — ABNORMAL HIGH (ref 7–18)
BUN: 87 mg/dL — AB (ref 7–18)
CHLORIDE: 92 mmol/L — AB (ref 98–107)
CO2: 20 mmol/L — AB (ref 21–32)
Calcium, Total: 9 mg/dL (ref 8.5–10.1)
Calcium, Total: 8.1 mg/dL — ABNORMAL LOW (ref 8.5–10.1)
Chloride: 90 mmol/L — ABNORMAL LOW (ref 98–107)
Co2: 23 mmol/L (ref 21–32)
Creatinine: 4.34 mg/dL — ABNORMAL HIGH (ref 0.60–1.30)
Creatinine: 4.31 mg/dL — ABNORMAL HIGH (ref 0.60–1.30)
EGFR (African American): 13 — ABNORMAL LOW
EGFR (African American): 13 — ABNORMAL LOW
EGFR (Non-African Amer.): 11 — ABNORMAL LOW
GFR CALC NON AF AMER: 11 — AB
GLUCOSE: 129 mg/dL — AB (ref 65–99)
GLUCOSE: 366 mg/dL — AB (ref 65–99)
Osmolality: 286 (ref 275–301)
Osmolality: 293 (ref 275–301)
POTASSIUM: 3.9 mmol/L (ref 3.5–5.1)
POTASSIUM: 4.7 mmol/L (ref 3.5–5.1)
SODIUM: 127 mmol/L — AB (ref 136–145)
Sodium: 125 mmol/L — ABNORMAL LOW (ref 136–145)

## 2013-12-13 LAB — CULTURE, BLOOD (SINGLE)

## 2014-01-01 ENCOUNTER — Ambulatory Visit: Payer: Self-pay | Admitting: Internal Medicine

## 2014-01-01 DEATH — deceased

## 2014-04-24 NOTE — Consult Note (Signed)
REASON FOR CONSULT: acute on chronic kidney disease and hypervolemia REQUESTING CONSULT: Dr. Volanda Napoleon 74WE F with diastolic dysfunction, atrial fibrillation, HTN, CKD who presented to [facility] yesterday with hypoxia to 88% noted by her HHRN. She describes a 4 day history of productive cough and increasing DOE, poor po intake though she says she was still taking medications. She was found to have acute on chronic kidney disease with creatinine of 4.7, up from baseline of ~1.8 and BNP ~54000 (last was 8382 during 11/2013 admission). Her CXR showed pulmonary edema and COPD. She was started on Lasix 40 IV BID, IV azithromycin and admitted. Overnight she developed A fib with RVR prompting stepdown transfer and diltiazem gtt initiation. BPs have been in the 100s/60s and it appears that her usual baseline is more in the 150s on her antiHTN medications. Her antiHTN meds are on hold. has been seen by palliative care to address goals of care as this is her fifth admission in 5 months. She is currently DNR and discussions are being had with the patient and her family. Hospice referral has been considered. I discussed at length with her sister, daughter and granddaughter. She's been gradually declining over the past 5 months and moved in with her daughter after the last admission. She is still doing ADLs but requiring assistance was last seen by Dr. Smith Mince in 08/2013 at Nephrology clinic: her creatinine was 1.88 at that time. During an admission in Oct her creatinine was 1.7. During an admission in Nov creatinine was 2 and BNP 8382.  10 system ROS outline per HPI above MEDICAL HISTORY: 1. Diastolic dysfunction, ejection fraction 55%-60% by 2-D echocardiogram, August 2015. 2. Paroxysmal atrial fibrillation. Chronic kidney disease, stage 4, with a baseline creatinine of 1.8-2.Diabetes mellitus, type 2.Hypertension. Glaucoma. Anemia of chronic disease. History of breast cancer, status post right-sided lumpectomy, chemotherapy, and  radiation with subsequent right arm lymphedema. Osteoporosis. Peptic ulcer disease. Peripheral vascular disease. Gastroesophageal reflux disease. Rheumatoid arthritis.  PAST SURGICAL HISTORY:  1. Femoral stent placed for peripheral vascular disease. Renal artery stent. Right lumpectomy.Lymph node resection.   SOCIAL HISTORY: The patient currently lives with her daughter. 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 She quit smoking 33 years ago, but has a 20 pack-year smoking history. She does not smoke cigarettes currently. Does not drink alcohol. She does not use oxygen at home. She does use a walker.   FAMILY HISTORY: no family history of CKD  MEDICATIONS:uloric 80, telmisartan 80 BID, sertraline 25 daily, prednisone 1mg  daily, oscal 1 daily, omeprazole 20 BID, novolog SSI, Toprol half tablet, methotrexate 2.5 3x/wk, levemir 20 daily, Lasix 20 daily, hydralazine 10 TID, folic acid, fish oil, clonazepam 0.5 qhs, atorvastatin 40 daily, asa 81 daily, amlodipine 5 dailyin hospital: diltiazem gtt, azithromycin, asa 81, heparin SQ TID, simvastatin 20, Lasix 40 IV BID, SSI, Toprol 50, protonix 40, prednisone 1mg , PRNs Zofran, APAP, colace CODEINE, MORPHINE, SULFA DRUGS, PENICILLIN, ZESTRIL, KEFLEX, AMBIEN, CONTRAST DYE, MAXZIDE.  EXAMT 97.4 BP 100s/50-60s since admission HR 60s O2 sat 94% 3L  150mL/450mL chronically ill appearing woman who is somewhat lethargic+glasses, anictericMM tackyelevated JVDtachycardic to 120-150s during exam, no rub or s4 appreciatedslightly increased WOB, +exp wheezes, rhonchi, basilar ralessoft, nontenderRUE with lympedema o/w minimal edemacool and drysomewhat decreased LOC but able to attend for short periods of timeHb 9.6, Na 129, K 3.3, Cl 91, Bicarb 27, BUN 87, Cr 4.7, Gluc 245, Ca 8.11, trop 0.49 <-- [id number] cx [id number], K 3.8, Cl 89, Bicarb 28, BUN 86, Cr 4.69, Gluc 429,  Ca 8.7 DXR - DXR PORTABLE CHEST SINGLE VIEW - Dec 08 2013 4:08PM DATA: Short of breathCHEST - 1 VIEW11/18/2015progression  of bilateral effusions and compressive atelectasis in the lung bases. Pulmonary vascular congestion also has developed since the prior study.COPD with hyperinflation and apical scarring bilaterally. COPD. Congestive heart failure with vascular congestion and progression of bilateral pleural effusions. and Recommendations: 74yo F with HTN, DM, diastolic dysfunction, CKD who is admitted with hypoxia, AoCKD and now has atrial fibrillation with RVR and hypotension.  on chronic kidney disease: Suspect she has ischemic ATN from hypotension related to A fib with RVR. As such, do not expect her renal function to quickly improve. I support rate control and measures to improve hypotension. I had a discussion regarding comfort/supportive care versus dialysis (time limited trial) with the family at the bedside and plan to continue this discussion. I would favor supportive care only given her gradual decline over the past 5 months and frail status. [place] cardiology involvement for RVRgave KCl 31mEq now to help normalize K in effort to break A fib; ordered Mag level and if low would supplementwith continued attempts at diuresis. Consider stress dose steroids given chronically on prednisone and hypotensive now - I will defer this decision to the hospitalist dysfunction with pulmonary edema complicated by AKI above: CXR with pulmonary edema and rales on examwith continued diuresis but with hypotension and AKI this will be difficultBPs improve and still oliguric would increase lasix dose to 80 IV but in setting of hypotension will hold on increaseIVF and sodium intake will continue to follow closely. Please page me with questions or concerns. Jannifer Hick MD [phone]   Electronic Signatures: Justin Mend (MD)  (Signed on 09-Dec-15 15:57)  Authored  Last Updated: 09-Dec-15 15:57 by Justin Mend (MD)

## 2014-04-24 NOTE — H&P (Signed)
PATIENT NAME:  Patricia Mccoy, Patricia Mccoy MR#:  [id number] DATE OF BIRTH:  [birth year]  REFERRING EMERGENCY ROOM PHYSICIAN: Sheryl L. Benjaman Lobe, MD  PRIMARY CARE PHYSICIAN:  Bethena Roys. Sowles, MD  PRIMARY CARDIOLOGIST: Javier Docker. Ubaldo Glassing, MD  CHIEF COMPLAINT: Shortness of breath and hypoxia noted by home health nurse.   HISTORY OF PRESENT ILLNESS: This very pleasant 74 year old woman with past medical history of diastolic dysfunction, preserved ejection fraction, paroxysmal atrial fibrillation, hypertension, chronic kidney disease, stage 4, presents today after her home health nurse noted that her oxygen saturation was 88% on room air at home. She reports that for the past 4 days she has had a cough productive of thick yellow sputum. She has been increasingly short of breath with any exertion and has also had coughing spells with exertion. She denies fevers, chills, nausea, vomiting, or diarrhea. No myalgias. No temperatures measured at home. No sick contacts. On presentation to the Emergency Room, her oxygen saturation is 88% on room air, she is found to be in renal failure with elevated troponins and elevated blood sugars. She is admitted for further evaluation and treatment.   PAST MEDICAL HISTORY: 1.  Diastolic dysfunction, ejection fraction 55%-60% by 2-D echocardiogram, August 2015. 2.  Paroxysmal atrial fibrillation.  3.  Chronic kidney disease, stage 4, with a baseline creatinine of 1.8-2. 4.  Diabetes mellitus, type 2. 5.  Hypertension.  6.  Glaucoma.  7.  Anemia of chronic disease.  8.   History of breast cancer, status post right-sided lumpectomy, chemotherapy, and radiation with subsequent right arm lymphedema.  9.  Osteoporosis.  10.  Peptic ulcer disease.  11.  Peripheral vascular disease.  12.  Gastroesophageal reflux disease.  13.  Rheumatoid arthritis.    PAST SURGICAL HISTORY: 1.  Femoral stent placed for peripheral vascular disease.  2.  Renal artery stent.  3.  Right lumpectomy. 4.   Lymph node resection.   SOCIAL HISTORY: The patient currently lives with her daughter. 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 She quit smoking 33 years ago, but has a 20 pack-year smoking history. She does not smoke cigarettes currently. Does not drink alcohol. She does not use oxygen at home. She does use a walker.   FAMILY HISTORY: Positive for coronary artery disease and stroke in her mother.   ALLERGIES: CODEINE, MORPHINE, SULFA DRUGS, PENICILLIN, ZESTRIL, KEFLEX, AMBIEN, CONTRAST DYE, MAXZIDE.   HOME MEDICATIONS: 1.  Uloric 40 mg 1 tablet daily.  2.  Telmisartan 80 mg 1 tablet twice a day.  3.  Sertraline 50 mg, 0.5 tablet daily.  4.  PreserVision oral tablet 1 tablet twice a day.  5.  Prednisone 1 mg oral tablet once a day. 6.  Os-Cal 1 tablet once a day.  7.  Omeprazole 20 mg 1 tablet twice a day. 8.  NovoLog 100 units per mL subcutaneous solution prior to meals per sliding scale.  9.  Metoprolol  succinate ER 0.5 tablet once a day.  10.  Methotrexate 2.5 mg 3 tablets once a week.  11.  Levemir 100 units per mL 20 units subcutaneously once a day at noon.  12.  Lasix 20 mg 1 tablet daily.  13.  Hydralazine 10 mg 1 tablet 3 times a day.  14.  Folic acid 1 mg 1 tablet once a day.  15.  Fish oil 1 capsule once a day.  16.  Clonazepam 0.5 mg tablet once a day at bedtime. 17.  Atorvastatin 40 mg 1 tablet once a day.  18.  Aspirin 81 mg 1 tablet once a day.  19.  Amlodipine 5 mg 1 tablet 2 times a day.    REVIEW OF SYSTEMS: CONSTITUTIONAL: Negative for fevers, chills. Positive for weakness and fatigue. Negative for weight change.  HEENT: Negative for vision change, hearing change, pain in the eyes or ears, sore throat, difficulty swallowing. She does have upper and lower dentures.  RESPIRATORY: Positive for cough, sputum production. No wheezing, no hemoptysis, no known COPD, no tuberculosis.  CARDIOVASCULAR: Negative for chest pain, orthopnea, edema, palpitations.  GASTROINTESTINAL:  Negative for nausea, vomiting, diarrhea, or abdominal pain.  GENITOURINARY: Negative for dysuria or frequency.  MUSCULOSKELETAL: No new pain in the neck, back, shoulders, knees, or hips. No joint effusions. Does have chronic right arm lymphedema, does have a history of gout.  NEUROLOGIC: No focal numbness, weakness. No history of CVA or seizure, but seems to have some mild baseline cognitive impairment, likely early dementia.  PSYCHIATRIC: No uncontrolled anxiety or depression.   PHYSICAL EXAMINATION: VITAL SIGNS: Temperature 96, pulse 74, respirations 16, blood pressure 110/60, oxygenation 95% on 2 liters nasal cannula.  GENERAL: The patient appears frail, fatigued, and ill.  HEENT: Pupils equal, round, and reactive to light. Conjunctivae clear. Extraocular motion intact. Oral mucous membranes are dry. Upper and lower dentures are present. Posterior oropharynx is poorly visualized, but seems clear of exudate, edema, or erythema. No cervical lymphadenopathy, trachea midline, thyroid nontender.  PULMONARY: She has diffuse rhonchi and wheezes with lots of coughing during examination, fair air movement.  CARDIOVASCULAR: Distant heart sounds with 3/6 systolic ejection murmur, no peripheral edema. Peripheral pulses are 1+.  ABDOMEN: Soft, nontender, nondistended. Bowel sounds are normal.  MUSCULOSKELETAL: No joint effusions. Range of motion is normal. She does have right arm lymphedema which is stable. Strength is 4/5 throughout.  NEUROLOGIC: Cranial nerves II through XII grossly intact. Strength is diffusely weak, but not focal, nonfocal neurologic examination.  PSYCHIATRIC: The patient is alert, oriented to person and place, calm. No signs of uncontrolled depression or anxiety.   LABORATORY DATA: Sodium 128, potassium 3.8, chloride 89, bicarbonate 28, BUN 86, creatinine 4.69, glucose 429. BNP is 54,527. Troponin is 0.49. White blood cells 10.1, hemoglobin 11.1, platelets 279,000, MCV 97.   IMAGING:  Chest x-ray shows congestive heart failure with vascular congestion and progression of bilateral pleural effusions. Changes of possible COPD with hyperinflation and apical scarring bilaterally.   ASSESSMENT AND PLAN: 1.  Elevated troponin: She does have multiple cardiac risk factors. We will continue to cycle cardiac enzymes and admit to telemetry. We will continue with aspirin, beta blocker, statin. At this point will not initiate heparin or cardiology consultation. If troponins continue to elevate, would go ahead and do that. EKG with no signs of acute ischemia. She does have acute renal failure and acute diastolic dysfunction at this time which could contribute to elevated troponin.  2.  Acute renal failure:  She does report decreased p.o. intake and has continued to take her antihypertensive. She has a baseline creatinine of somewhere between 1.8-2. We will provide gentle hydration to hopefully improve renal function. Avoid nephrotoxins. Electrolytes are stable. We will get a nephrology consultation. Hyperglycemia likely also contributed to dehydration.   3.  Hyperglycemia: The patient does have diabetes mellitus. We will check a hemoglobin A1c. Will restart insulin. We will provide hydration.  4.  Hyponatremia: This is likely due to hyperglycemia. Providing hydration with normal saline. Recheck BMET in morning. 5.  Acute respiratory failure with hypoxia:  Oxygen saturation on room air is 88%. She does seem to have a mild diastolic congestive heart failure exacerbation with very elevated BNP. Chest x-ray does not show pneumonia, but does show some fluid in the fissures and hyperexpansion of the lungs consistent with possible bronchitis. Start azithromycin. Continue with nebulizer treatments. Blood cultures are pending. Continue with supplemental oxygen.  6.  Acute on chronic diastolic congestive heart failure exacerbation: BNP very elevated. Lung disease is likely contributing to this. We are providing  gentle hydration to improve kidney function so we will need to monitor respiratory status very carefully.  7.  Prophylaxis: Heparin for deep vein thrombosis prophylaxis.  8.  Gastroesophageal reflux disease: Proton pump inhibitor.   CODE STATUS: The patient is a DO NOT RESUSCITATE. I discussed this with the patient, her daughter, and her sister at the bedside and they were clear that she does not want resuscitation. I have placed this order. She also has had frequent readmissions, 4, over the past 2 months. I will consult palliative care to discuss goals of care.   TIME SPENT ON ADMISSION: 55 minutes.    ____________________________ Earleen Newport. Volanda Napoleon, MD cpw:LT D: 12/08/2013 17:38:56 ET T: 12/08/2013 18:21:50 ET JOB#: [id number]  cc: Barnetta Chapel P. Volanda Napoleon, MD, <Dictator> Aldean Jewett MD ELECTRONICALLY SIGNED 12/23/2013 9:08

## 2014-04-24 NOTE — H&P (Signed)
PATIENT NAME:  Patricia Mccoy, Patricia Mccoy MR#:  [id number] DATE OF BIRTH:  Jun 26, 1940  DATE OF ADMISSION:  08/30/2013  PRIMARY CARE PHYSICIAN: Bethena Roys. Ancil Boozer, MD  CARDIOLOGY: Corey Skains, MD  NEPHROLOGY: [facility],   ONCOLOGY: Martie Lee. Choksi, MD  RHEUMATOLOGY: Meda Klinefelter., MD   CHIEF COMPLAINT: Worsening lower extremity edema.   HISTORY OF PRESENT ILLNESS: This is a 74 year old female with known history of congestive heart failure, systolic, with a known mitral regurgitation and tricuspid regurgitation, as well as history of chronic kidney disease, baseline creatinine around 2, history of diabetes, history of rheumatoid arthritis, known history of atrial fibrillation in the past, presents with above-mentioned complaints. The patient reports she has chronic lower extremity edema, but has much worsened over the last few days. Reports even it started oozing, which prompted her to come to the ED.   The patient reports she had a dog scratch a couple of days ago, but she denies any bites. She reports she went to [facility] where she was started on doxycycline. She denies any fever, any chills, any erythema of the  lower extremity, or any worsening of the dog scratch site. She has been putting on topical antibiotics and taking p.o. doxycycline for it without worsening.   On further questioning, she admits to worsening orthopnea. She has a hospital bed where she usually sleeps in a reclining position, but reports over the last 2 nights, she has been having worsening of her shortness of breath upon sleeping as well, but she was not hypoxic.   The patient has significant elevated BNP at 16,900. As well, her chest x-ray did show persistent bilateral pleural effusion and emphysematous changes as well. The patient's workup was significant for chronic kidney disease, creatinine of 2, which is around her baseline but she had elevated troponin at 2 as well. The patient denies any  chest pain. The patient's most recent troponin was a few years ago, which was essentially within normal limits, but we looked at her EPIC chart with care everywhere, where she appears to be having chronically elevated troponins from different hospitalization at [facility] in the past as well. The patient was given 324 mg of aspirin in the ED and was started on IV Lasix. Hospitalist service requested to admit the patient for further evaluation. The patient's CK-MB was essentially within normal limits.   As well, patient admits to 13-pound weight loss recently over the last few weeks, but she had a recent colonoscopy and EGD done which did show multiple polyps status post biopsy and resection, which was done by Dr. Allen Norris, without evidence significant for malignancy on pathology. The patient had her most recent mammogram done in July of this year, which did not show any significant abnormalities.   PAST MEDICAL HISTORY:  1.  Systolic CHF.  2.  Chronic kidney disease.  3.  Anemia of chronic disease.  4.  Diabetes.  5.  Osteoarthritis.  6.  Atrial fibrillation. 7.  History of breast cancer, status post lumpectomy, radiation, and chemotherapy followed by Dr. Oliva Bustard. 8.  History of bursitis.  9.  History of peripheral vascular disease.  10.  Osteoporosis.  11. Gout.  12.  Peptic ulcer disease.  13.  Gastroesophageal reflux disease.   PAST SURGICAL HISTORY: Lumpectomy.   SOCIAL HISTORY: The patient lives at home. Quit smoking in the 1980s. No alcohol. No drug abuse.   FAMILY HISTORY: Significant for CVA and coronary artery disease in the family.  ALLERGIES: AMBIEN, CODEINE, CONTRAST DYE, KEFLEX, MAXZIDE, MORPHINE, PENICILLIN, SULFA DRUGS, AND ZESTRIL.   HOME MEDICATIONS: Aspirin 81 mg daily dose, digoxin 125 mcg oral every other day, clonazepam 0.5 mg at bedtime, Lantus 20 units subcutaneous in the morning, Uloric 40 mg oral daily, doxycycline 100 mg oral 2 times a day, metoprolol 100 mg oral 2  times a day, methotrexate 2.5 mg 3 tablets weekly (the patient does not recall which day in the week), Norvasc 5 mg oral 2 times a day, Bactroban ointment applied to site 3 times a day, Lasix 40 mg oral daily, omeprazole 20 mg oral 2 times a day, Os-Cal 500 with vitamin D daily, folic acid 1 mg oral daily, soft stool 1 capsule 2 times a day.   REVIEW OF SYSTEMS:  CONSTITUTIONAL: Denies fever, chills. Reports fatigue. Reports weight loss of 13 pounds over the last few weeks. Denies blurry vision, double vision, inflammation.  ENT: Denies tinnitus, ear pain, hearing loss, epistaxis.  RESPIRATORY: Denies cough, wheezing, hemoptysis. Reports dyspnea.  CARDIOVASCULAR: Denies chest pain, palpitations, syncope. Reports worsening lower extremity edema with oozing.  GASTROINTESTINAL: Denies nausea, vomiting, diarrhea, abdominal pain, hematemesis.  GENITOURINARY: Denies dysuria, hematuria, renal colic.  ENDOCRINE: Denies polyuria, polydipsia, heat or cold intolerance.  HEMATOLOGY: Denies anemia, easy bruising, bleeding diathesis.  INTEGUMENT: Denies acne, rash, or skin lesion.  MUSCULOSKELETAL: Reports swelling of lower extremity. Reports history of gout and rheumatoid arthritis. Denies any cramps.  NEUROLOGIC: Denies CVA, TIA, tremors, vertigo, ataxia.  PSYCHIATRIC: Denies anxiety, insomnia, or depression.   PHYSICAL EXAMINATION:  VITAL SIGNS: Temperature 97.9, pulse 75, respiratory rate 18, blood pressure 153/85, saturating 99% on room air.  GENERAL: Frail, elderly female who looks comfortable in bed, in no apparent distress.  HEENT: Head atraumatic, normocephalic. Pupils equal, reactive to light. Pink conjunctivae. Anicteric sclerae. Moist oral mucosa. No pharyngeal erythema. No nasal discharge.  NECK: Supple. No thyromegaly. No JVD. No carotid bruits. No nuchal rigidity. Trachea is midline.  CHEST: Good air entry bilaterally with bibasilar crackles. No wheezing, rales, or rhonchi. No use of accessory  muscles.  CARDIOVASCULAR: S1, S2 heard. No rubs or gallops. Has mild systolic murmur. PMI nondisplaced. Regular rate and rhythm.  ABDOMEN: Soft, nontender, nondistended. Bowel sounds present. No rebound, no guarding. No hepatosplenomegaly.  EXTREMITIES: Bilateral lower extremity edema +3 with oozing on the left. Has site of the dog scratch covered by a small bandage. Site does not look infected, just mild laceration healing nicely. Bilateral pedal pulses diminished but felt bilaterally. No clubbing. No cyanosis.  SKIN: Normal skin turgor. Warm and dry.  MUSCULOSKELETAL: No joint effusion or erythema. No CVA tenderness.  PSYCHIATRIC: Appropriate affect. Awake, alert x3. Intact judgment and insight.  NEUROLOGIC: Cranial nerves grossly intact. Motor 5 out of 5. No focal deficits.   PERTINENT LABORATORY STUDIES: BNP 16,918. Glucose 224, BUN 27, creatinine 1.98, sodium 136, potassium 3.7, chloride 98. Troponin 2, CK-MB 2.4. White blood cell 8.5, hemoglobin 12.9, hematocrit 40, platelets 344,000.   IMAGING: X-ray showing bilateral pleural effusion.   ASSESSMENT AND PLAN:  1.  Worsening lower extremity edema and shortness of breath. This is congestive heart failure, systolic. Patient is known to have history of systolic congestive heart failure. She will be started on IV Lasix 40 mg IV every 12 hours. We will admit her to telemetry, cycle her cardiac enzymes. We will consult cardiology service, Dr. Nehemiah Massed, her cardiologist as well. We will continue the patient on her beta blockers. No ACE inhibitor or ARB secondary  to her chronic kidney disease. Continue her aspirin as well. We will leave it up to cardiology to see if she needs any repeat echocardiogram. At this point, I do not know if she had any recent echocardiogram done or not. The patient reports last time she was seen by Dr. Nehemiah Massed was a few weeks ago.  2.  Elevated troponins. The patient was given aspirin 324 mg. She denies any chest pain. The  patient had chronically elevated troponin compared to previous labs done in different facilities. At this point, she denies any chest pain. She will be admitted to telemetry. We will continue to cycle her cardiac enzymes and follow the trend. So far, her CK-MB is within normal limits. Most likely troponins are elevated due to her chronic kidney disease and possible congestive heart failure. We will consult cardiology service.  3.  Chronic kidney disease, appears to be stable at baseline. We will continue to monitor especially since she is on large-dose diuresis. We will monitor her closely.  4.  History of breast cancer. The patient reports a 13-pound weight loss, but she had normal mammogram in July. We will check CT chest given her pleural effusion to rule out any metastasis.  5.  History of atrial fibrillation, currently on digoxin and beta blockers, rate controlled, normal sinus rhythm. We will check digoxin level.  6.  Diabetes mellitus. We will resume back on her Lantus. Will add insulin sliding scale.  7.  Rheumatoid arthritis. The patient will be resumed back on her methotrexate, but once we know which day of the week she takes it, as she cannot recall right now.  8.  Gastroesophageal reflux disease. Continue with PPI.  9.  Deep vein thrombosis prophylaxis. Subcutaneous heparin.   CODE STATUS: Discussed with the patient. She does not have a living will. Her daughter is her healthcare power of attorney and she is a full code.   TOTAL TIME SPENT ON ADMISSION AND PATIENT CARE: 55 minutes.    ____________________________ Albertine Patricia, MD dse:ah D: 08/30/2013 06:50:21 ET T: 08/30/2013 07:17:56 ET JOB#: [id number]  cc: Albertine Patricia, MD, <Dictator> Belva Koziel Graciela Husbands MD ELECTRONICALLY SIGNED 09/09/2013 23:37

## 2014-04-24 NOTE — Consult Note (Signed)
Chief Complaint:  Subjective/Chief Complaint Patient states to be doing reasonably well status post thoracentesis on the left feels much better improve for dyspnea and shortness of breath.   VITAL SIGNS/ANCILLARY NOTES: **Vital Signs.:   02-Oct-15 13:45  Temperature Temperature (F) 97.8  Celsius 36.5  Temperature Source oral  Pulse Pulse 84  Pulse source if not from Vital Sign Device per Telemetry Clerk; SR  Respirations Respirations 20  Systolic BP Systolic BP 606  Diastolic BP (mmHg) Diastolic BP (mmHg) 62  Mean BP 93  Pulse Ox % Pulse Ox % 97  Pulse Ox Activity Level  At rest  Oxygen Delivery 2L  *Intake and Output.:   02-Oct-15 08:00  Grand Totals Intake:  120 Output:      Net:  120 24 Hr.:  120  Oral Intake      In:  120  Unmeasured Intake  Few bites   Brief Assessment:  GEN well developed, well nourished, no acute distress   Cardiac Regular  murmur present  -- carotid bruits  -- JVD   Respiratory normal resp effort  clear BS   Gastrointestinal Normal   Gastrointestinal details normal Soft  Nontender  Nondistended  No masses palpable  Bowel sounds normal   EXTR negative cyanosis/clubbing, negative edema   Lab Results: Routine Chem:  01-Oct-15 05:58   Result Comment TROPONIN - RESULTS VERIFIED BY REPEAT TESTING.  - CALLED TO JACKIE PAGE AT 0045 ON  - 10/01/2013.Marland KitchenMarland KitchenTPL  - READ-BACK PROCESS PERFORMED.  Result(s) reported on 01 Oct 2013 at 06:51AM.  02-Oct-15 03:53   Glucose, Serum  258  BUN  22  Creatinine (comp)  1.67  Sodium, Serum  135  Potassium, Serum 3.6  Chloride, Serum 98  CO2, Serum 29  Calcium (Total), Serum 8.5  Anion Gap 8  Osmolality (calc) 282  eGFR (African American)  39  eGFR (Non-African American)  32 (eGFR values <43m/min/1.73 m2 may be an indication of chronic kidney disease (CKD). Calculated eGFR, using the MRDR Study equation, is useful in  patients with stable renal function. The eGFR calculation will not be reliable in acutely  ill patients when serum creatinine is changing rapidly. It is not useful in patients on dialysis. The eGFR calculation may not be applicable to patients at the low and high extremes of body sizes, pregnant women, and vetetarians.)   Radiology Results: XRay:    01-Oct-15 01:56, Chest Portable Single View  Chest Portable Single View   REASON FOR EXAM:    Chest pain  COMMENTS:       PROCEDURE: DXR - DXR PORTABLE CHEST SINGLE VIEW  - Oct 01 2013  1:56AM     CLINICAL DATA:  Shortness of breath, history of CHF    EXAM:  PORTABLE CHEST - 1 VIEW    COMPARISON:  09/05/2013    FINDINGS:  Cardiomegaly with mild interstitial edema. Moderate left and small  right pleural effusions. Associated bilateral lower lobe opacities,  likely atelectasis. No pneumothorax.     IMPRESSION:  Cardiomegaly with mild interstitial edema.    Moderate left and small right pleural effusions.    Associated bilateral lower lobe opacities, likely atelectasis.      Electronically Signed    By: SJulian HyM.D.    On: 10/01/2013 02:00       Verified By: SJulian Hy M.D.,    01-Oct-15 16:36, Chest 1 View for Post Thoracentesis  Chest 1 View for Post Thoracentesis   REASON FOR EXAM:  post thoracentesis  COMMENTS:       PROCEDURE: DXR - DXR CHEST 1 VIEW POST [facility]  - Oct 01 2013  4:36PM     CLINICAL DATA:  Post left-sided thoracentesis    EXAM:  CHEST XRAY 1 VIEW    COMPARISON:  Earlier same day ; 09/05/2013; chest CT - earlier same  day    FINDINGS:  Grossly unchanged cardiac silhouette and mediastinal contours with  atherosclerotic plaque within the thoracic aorta. No change to  minimal reduction in residual small likely partially loculated  left-sided effusion post thoracentesis. Unchanged small right-sided  pleural effusion. Grossly unchanged slightly asymmetric biapical  pleural parenchymal thickening, left greater than right. No  pneumothorax. Pulmonary vasculature remains  indistinct with  cephalization of flow. Minimally improved aeration of lung bases  with persistent bibasilar opacities, left greater than right. No new  focal airspace opacities. Unchanged bones. Surgical clips overlie  the right axilla.     IMPRESSION:  1. Minimal reductionin persistent small likely partially loculated  left-sided effusion post thoracentesis. No pneumothorax.  2. Unchanged small right-sided pleural effusion  3. Minimally improved aeration of lungs with persistent bibasilar  opacities, left greater thanright - atelectasis versus infiltrate.  4. Similar findings of suspected mild pulmonary edema superimposed  on advanced emphysematous change as was better demonstrated on chest  CT performed earlier same day.      Electronically Signed    By: Sandi Mariscal M.D.    On: 10/01/2013 16:42         Verified By: Aileen Fass, M.D.,  [place]:    01-Oct-15 16:42, US Guide Thoracentesis Left  US Guide Thoracentesis Left   REASON FOR EXAM:    plueral effusion, to be done by Dr Pascal Lux  COMMENTS:       PROCEDURE: [place]  - US GUIDED THORACENTESIS LEFT  - Oct 01 2013  4:42PM     INDICATION:  Remote history of fall, now with small bilateral pleural effusions.  Please perform ultrasound-guided thoracentesis for therapeutic and  diagnostic purposes.    EXAM:  ULTRASOUND GUIDED THORACENTESIS    COMPARISON:  Chest radiograph - earlier same day; chest CT- earlier  same day  MEDICATIONS:  None    COMPLICATIONS:  None immediate    TECHNIQUE:  Informed written consent was obtained from the patient after a  discussion of the risks, benefits and alternatives to treatment. A  timeout was performed prior to the initiation of the procedure.    Preprocedural ultrasound scanning demonstrated a small left and only  a trace right-sided pleural effusion. As such, the decision was made  to perform ultrasound-guided left-sided thoracentesis. The left  lower chest was prepped and draped in  the usual sterile fashion. 1%  lidocaine was used for local anesthesia.    An ultrasound image was saved for documentation purposes. An 8 Fr  Safe-T-Centesis catheter was introduced. The thoracentesis was  performed. The catheter was removed and a dressing was applied. The  patient tolerated the procedure well without immediate post  procedural complication. The patient was escorted to have an upright  chest radiograph.    FINDINGS:  A total of approximately 175 cc of serous fluid was removed.  Requested samples were sent to the laboratory.     IMPRESSION:  1. Successful ultrasound-guided left sided thoracentesis yielding  175 cc of pleural fluid.  2. Trace right-sided pleural effusion, too small for percutaneous  sampling.      Electronically Signed  By: Sandi Mariscal M.D.    On: 10/01/2013 16:46         Verified By: Aileen Fass, M.D.,  Cardiology:    01-Oct-15 01:41, ED ECG  Ventricular Rate 98  Atrial Rate 98  P-R Interval 164  QRS Duration 84  QT 342  QTc 436  P Axis 25  R Axis 51  T Axis -8  ECG interpretation   Normal sinus rhythm  T wave abnormality, consider inferior ischemia  Abnormal ECG  When compared with ECG of 30-Aug-2013 04:55,  No significant change was found  ----------unconfirmed----------  Confirmed by OVERREAD, NOT ([id number]), editor PEARSON, BARBARA ([id number]) on 10/01/2013 8:32:05 AM  ED ECG   CT:    01-Oct-15 02:56, CT Chest Without Contrast  CT Chest Without Contrast   REASON FOR EXAM:    dyspnea, hypoxia  COMMENTS:       PROCEDURE: CT  - CT CHEST WITHOUT CONTRAST  - Oct 01 2013  2:56AM     CLINICAL DATA:  Shortness of breath.  Hypoxia.    EXAM:  CT CHEST WITHOUT CONTRAST    TECHNIQUE:  Multidetector CT imaging of the chest was performed following the  standard protocol without IV contrast..    COMPARISON:  08/30/2013  FINDINGS:  Mild cardiac enlargement. Coronary artery calcifications. Diffuse  calcification of the thoracic aorta. No  aortic aneurysm. Mild  prominence of mediastinal lymph nodes with a pretracheal node  measuring 13 mm diameter. This is probably reactive. Esophagus is  mostly decompressed. Moderate size bilateral pleural effusions.  Pleural thickening and calcification in the apices. This islikely  postinflammatory and stable since previous study. Evaluation of  lungs is limited due to respiratory motion artifact but there  appears to be diffuse emphysematous change. Interstitial changes  probably representing edema. Atelectasis or consolidation in the  lung bases bilaterally.    Visualized portions of the upper abdominal organs are grossly  unremarkable except for extensive vascular calcifications.   IMPRESSION:  Cardiac enlargement with probable interstitial pulmonary edema.  Diffuse emphysematous changes in the lungs. Atelectasis or  consolidation in the lung bases. Postinflammatory changes in the  lung apices.      Electronically Signed    By: Lucienne Capers M.D.    On: 10/01/2013 03:32         Verified By: Neale Burly, M.D.,   Assessment/Plan:  Assessment/Plan:  Assessment IMP  pleural effusion  congestive heart failure  shortness of breath  hypertension  renal insufficiency  diabetes .   Plan PLAN  supplemental oxygen therapy  continue blood pressure support  continue chronic renal insufficiency follow-up with Nephrology as necessary  diabetes continue current medications  status post thoracentesis on the right  continue diuretic therapy  increase activity   Electronic Signatures: Lujean Amel D (MD)  (Signed 02-Oct-15 14:48)  Authored: Chief Complaint, VITAL SIGNS/ANCILLARY NOTES, Brief Assessment, Lab Results, Radiology Results, Assessment/Plan   Last Updated: 02-Oct-15 14:48 by Lujean Amel D (MD)

## 2014-04-24 NOTE — Consult Note (Signed)
Chief Complaint:  Subjective/Chief Complaint She states to feel much better she is ambulating well in the halls this reduce shortness of breath denies any pain   VITAL SIGNS/ANCILLARY NOTES: **Vital Signs.:   03-Oct-15 13:45  Vital Signs Type Routine  Temperature Temperature (F) 97.5  Celsius 36.3  Pulse Pulse 71  Respirations Respirations 20  Systolic BP Systolic BP 956  Diastolic BP (mmHg) Diastolic BP (mmHg) 61  Mean BP 95  Pulse Ox % Pulse Ox % 98  Pulse Ox Activity Level  At rest  Oxygen Delivery Room Air/ 21 %  *Intake and Output.:   03-Oct-15 09:55  Grand Totals Intake:   Output:  100    Net:  -100 24 Hr.:  140  Urine ml     Out:  100  Urinary Method  Void; Up to BR   Brief Assessment:  GEN well developed, well nourished, no acute distress   Cardiac Regular  murmur present  -- carotid bruits  -- JVD   Respiratory normal resp effort  clear BS   Gastrointestinal Normal   Gastrointestinal details normal Soft  Nontender  Nondistended  No masses palpable  Bowel sounds normal   EXTR negative cyanosis/clubbing, negative edema   Lab Results: Routine Chem:  02-Oct-15 03:53   Glucose, Serum  258  BUN  22  Creatinine (comp)  1.67  Sodium, Serum  135  Potassium, Serum 3.6  Chloride, Serum 98  CO2, Serum 29  Calcium (Total), Serum 8.5  Anion Gap 8  Osmolality (calc) 282  eGFR (African American)  39  eGFR (Non-African American)  32 (eGFR values <70m/min/1.73 m2 may be an indication of chronic kidney disease (CKD). Calculated eGFR, using the MRDR Study equation, is useful in  patients with stable renal function. The eGFR calculation will not be reliable in acutely ill patients when serum creatinine is changing rapidly. It is not useful in patients on dialysis. The eGFR calculation may not be applicable to patients at the low and high extremes of body sizes, pregnant women, and vetetarians.)  03-Oct-15 06:07   Glucose, Serum  317  BUN  33  Creatinine (comp)   1.98  Sodium, Serum  131  Potassium, Serum 3.8  Chloride, Serum  95  CO2, Serum 27  Calcium (Total), Serum  7.7  Anion Gap 9 (Result(s) reported on 03 Oct 2013 at 06:54AM.)  Osmolality (calc) 282  Routine Hem:  03-Oct-15 06:07   WBC (CBC) 6.9  RBC (CBC)  3.01  Hemoglobin (CBC)  9.8  Hematocrit (CBC)  29.2  Platelet Count (CBC) 261  MCV 97  MCH 32.6  MCHC 33.6  RDW 14.5  Neutrophil % 70.9  Lymphocyte % 16.9  Monocyte % 8.4  Eosinophil % 3.0  Basophil % 0.8  Neutrophil # 4.9  Lymphocyte # 1.2  Monocyte # 0.6  Eosinophil # 0.2  Basophil # 0.1 (Result(s) reported on 03 Oct 2013 at 06:54AM.)   Radiology Results: XRay:    01-Oct-15 01:56, Chest Portable Single View  Chest Portable Single View   REASON FOR EXAM:    Chest pain  COMMENTS:       PROCEDURE: DXR - DXR PORTABLE CHEST SINGLE VIEW  - Oct 01 2013  1:56AM     CLINICAL DATA:  Shortness of breath, history of CHF    EXAM:  PORTABLE CHEST - 1 VIEW    COMPARISON:  09/05/2013    FINDINGS:  Cardiomegaly with mild interstitial edema. Moderate left and small  right pleural  effusions. Associated bilateral lower lobe opacities,  likely atelectasis. No pneumothorax.     IMPRESSION:  Cardiomegaly with mild interstitial edema.    Moderate left and small right pleural effusions.    Associated bilateral lower lobe opacities, likely atelectasis.      Electronically Signed    By: Julian Hy M.D.    On: 10/01/2013 02:00       Verified By: Julian Hy, M.D.,    01-Oct-15 16:36, Chest 1 View for Post Thoracentesis  Chest 1 View for Post Thoracentesis   REASON FOR EXAM:    post thoracentesis  COMMENTS:       PROCEDURE: DXR - DXR CHEST 1 VIEW POST [facility]  - Oct 01 2013  4:36PM     CLINICAL DATA:  Post left-sided thoracentesis    EXAM:  CHEST XRAY 1 VIEW    COMPARISON:  Earlier same day ; 09/05/2013; chest CT - earlier same  day    FINDINGS:  Grossly unchanged cardiac silhouette and mediastinal  contours with  atherosclerotic plaque within the thoracic aorta. No change to  minimal reduction in residual small likely partially loculated  left-sided effusion post thoracentesis. Unchanged small right-sided  pleural effusion. Grossly unchanged slightly asymmetric biapical  pleural parenchymal thickening, left greater than right. No  pneumothorax. Pulmonary vasculature remains indistinct with  cephalization of flow. Minimally improved aeration of lung bases  with persistent bibasilar opacities, left greater than right. No new  focal airspace opacities. Unchanged bones. Surgical clips overlie  the right axilla.     IMPRESSION:  1. Minimal reductionin persistent small likely partially loculated  left-sided effusion post thoracentesis. No pneumothorax.  2. Unchanged small right-sided pleural effusion  3. Minimally improved aeration of lungs with persistent bibasilar  opacities, left greater thanright - atelectasis versus infiltrate.  4. Similar findings of suspected mild pulmonary edema superimposed  on advanced emphysematous change as was better demonstrated on chest  CT performed earlier same day.      Electronically Signed    By: Sandi Mariscal M.D.    On: 10/01/2013 16:42         Verified By: Aileen Fass, M.D.,  [place]:    01-Oct-15 16:42, US Guide Thoracentesis Left  US Guide Thoracentesis Left   REASON FOR EXAM:    plueral effusion, to be done by Dr Pascal Lux  COMMENTS:       PROCEDURE: [place]  - US GUIDED THORACENTESIS LEFT  - Oct 01 2013  4:42PM     INDICATION:  Remote history of fall, now with small bilateral pleural effusions.  Please perform ultrasound-guided thoracentesis for therapeutic and  diagnostic purposes.    EXAM:  ULTRASOUND GUIDED THORACENTESIS    COMPARISON:  Chest radiograph - earlier same day; chest CT- earlier  same day  MEDICATIONS:  None    COMPLICATIONS:  None immediate    TECHNIQUE:  Informed written consent was obtained from the patient after  a  discussion of the risks, benefits and alternatives to treatment. A  timeout was performed prior to the initiation of the procedure.    Preprocedural ultrasound scanning demonstrated a small left and only  a trace right-sided pleural effusion. As such, the decision was made  to perform ultrasound-guided left-sided thoracentesis. The left  lower chest was prepped and draped in the usual sterile fashion. 1%  lidocaine was used for local anesthesia.    An ultrasound image was saved for documentation purposes. An 8 Fr  Safe-T-Centesis catheter was introduced.  The thoracentesis was  performed. The catheter was removed and a dressing was applied. The  patient tolerated the procedure well without immediate post  procedural complication. The patient was escorted to have an upright  chest radiograph.    FINDINGS:  A total of approximately 175 cc of serous fluid was removed.  Requested samples were sent to the laboratory.     IMPRESSION:  1. Successful ultrasound-guided left sided thoracentesis yielding  175 cc of pleural fluid.  2. Trace right-sided pleural effusion, too small for percutaneous  sampling.      Electronically Signed    By: Sandi Mariscal M.D.    On: 10/01/2013 16:46         Verified By: Aileen Fass, M.D.,  Cardiology:    01-Oct-15 01:41, ED ECG  Ventricular Rate 98  Atrial Rate 98  P-R Interval 164  QRS Duration 84  QT 342  QTc 436  P Axis 25  R Axis 51  T Axis -8  ECG interpretation   Normal sinus rhythm  T wave abnormality, consider inferior ischemia  Abnormal ECG  When compared with ECG of 30-Aug-2013 04:55,  No significant change was found  ----------unconfirmed----------  Confirmed by OVERREAD, NOT (100), editor PEARSON, BARBARA ([id number]) on 10/01/2013 8:32:05 AM  ED ECG   CT:    01-Oct-15 02:56, CT Chest Without Contrast  CT Chest Without Contrast   REASON FOR EXAM:    dyspnea, hypoxia  COMMENTS:       PROCEDURE: CT  - CT CHEST WITHOUT CONTRAST  -  Oct 01 2013  2:56AM     CLINICAL DATA:  Shortness of breath.  Hypoxia.    EXAM:  CT CHEST WITHOUT CONTRAST    TECHNIQUE:  Multidetector CT imaging of the chest was performed following the  standard protocol without IV contrast..    COMPARISON:  08/30/2013  FINDINGS:  Mild cardiac enlargement. Coronary artery calcifications. Diffuse  calcification of the thoracic aorta. No aortic aneurysm. Mild  prominence of mediastinal lymph nodes with a pretracheal node  measuring 13 mm diameter. This is probably reactive. Esophagus is  mostly decompressed. Moderate size bilateral pleural effusions.  Pleural thickening and calcification in the apices. This islikely  postinflammatory and stable since previous study. Evaluation of  lungs is limited due to respiratory motion artifact but there  appears to be diffuse emphysematous change. Interstitial changes  probably representing edema. Atelectasis or consolidation in the  lung bases bilaterally.    Visualized portions of the upper abdominal organs are grossly  unremarkable except for extensive vascular calcifications.   IMPRESSION:  Cardiac enlargement with probable interstitial pulmonary edema.  Diffuse emphysematous changes in the lungs. Atelectasis or  consolidation in the lung bases. Postinflammatory changes in the  lung apices.      Electronically Signed    By: Lucienne Capers M.D.    On: 10/01/2013 03:32         Verified By: Neale Burly, M.D.,   Assessment/Plan:  Assessment/Plan:  Assessment IMP  hypoxemia  dyspnea on exertion  pleural effusion  congestive heart failure  shortness of breath  hypertension  renal insufficiency  diabetes  status post thoracentesis .   Plan PLAN  recommend physical therapy to help with strength training and ambulation  supplemental oxygen therapy  continue blood pressure support  continue chronic renal insufficiency follow-up with Nephrology as necessary  diabetes continue  current medications  status post thoracentesis on the right  continue diuretic therapy  increase  activity  hopefully discharge home within the next 24-48 hours   Electronic Signatures: Lujean Amel D (MD)  (Signed 05-Oct-15 13:32)  Authored: Chief Complaint, VITAL SIGNS/ANCILLARY NOTES, Brief Assessment, Lab Results, Radiology Results, Assessment/Plan   Last Updated: 05-Oct-15 13:32 by Yolonda Kida (MD)

## 2014-04-24 NOTE — Discharge Summary (Signed)
PATIENT NAME:  Patricia Mccoy, Patricia Mccoy MR#:  [id number] DATE OF BIRTH:  07-Jul-1940  PRIMARY CARE PHYSICIAN:  Bethena Roys. Sowles, MD  FINAL DIAGNOSES: 1.  Acute on chronic diastolic congestive heart failure.  2.  Hypertension.  3.  Diabetes.  4.  Debility.  5.  Hypertension.   MEDICATIONS ON DISCHARGE: Include amlodipine 5 mg 1 tablet twice a day, omeprazole 20 mg twice a day, PreserVision 1 tablet twice a day, clonazepam 1 mg, 1/2 tablet at bedtime, digoxin 125 mcg 1 tablet every other day, folate acid 1 mg daily, Uloric 80 mg, 1/2 dose, 40 mg daily, aspirin 81 mg daily, Os-Cal with D, 1 tablet daily, methotrexate 2.5 mg 3 tablets once a week in the a.m., furosemide 40 mg twice a day, prednisone 1 mg daily, atorvastatin 40 mg at bedtime, clonazepam 0.5 mg 1 at bedtime, telmisartan 80 mg 1 tablet daily, hydralazine 10 mg twice a day, metoprolol 50 mg daily, Lantus 10 units subcutaneous injection at bedtime, NovoLog 3 units subcutaneous injection prior to meals, if greater than 200, Celexa 10 mg at bedtime, Glucerna 237 mL twice a day. Stop taking Remeron.   HOME HEALTH: Yes. Physical therapy, nurse, nurse aide. Help with medications and strength.   DIET: Low sodium, carbohydrate-controlled diet, Glucerna twice a day, regular consistency.   FOLLOWUP: With Dr. Nehemiah Massed in 1 week, 1-2 weeks with Dr. Steele Sizer.   HOSPITAL COURSE: The patient came in with shortness of breath, was admitted with congestive heart failure, diastolic, acute on chronic, was started on Lasix.   LABORATORY AND RADIOLOGICAL DATA DURING THE HOSPITAL COURSE: BNP 8203. Troponin negative. Glucose 341, BUN 26, creatinine 1.85, sodium 129, potassium 3.3, chloride 94, CO2 of 28, calcium 8.1, alkaline phosphatase 190, ALT 31, AST 36, albumin 3.1. White blood cell count 7.3, H and H, 11.1 and 33.5, platelet count of 309,000. CPK 71. Urinalysis greater than 500 mg/dL of glucose.   EKG showed normal sinus rhythm, nonspecific ST-T wave  changes. Chest x-ray showed cardiomegaly with interstitial edema. Moderate left and right small pleural effusions, bilateral lower lobe opacities, likely atelectasis. CT scan of the chest showed cardiac enlargement, probably interstitial pulmonary edema, diffuse emphysematous changes, atelectasis, consolidation in the lung bases, post inflammatory changes in the lung apices.   Next troponin borderline at 0.10, next troponin borderline at 0.12. Next troponin borderline at 0.12. The pH body fluid 7.8. Pleural fluid 1.08, LDH body fluid 75, amylase 27, glucose 175, albumin 1.5, total protein body fluid 2.2. Creatinine upon discharge 1.98, sodium 131, potassium 3.8, hemoglobin 9.8. White count 6.9.   HOSPITAL COURSE PER PROBLEM LIST:  1.  The patient's acute on chronic diastolic congestive heart failure. The patient was started on IV Lasix. The patient was diuresed well, actually had a small thoracentesis, but only 175 mL removed. Upon discharge, the patient's lungs were clear. The patient went back on her usual dose of Lasix 40 mg twice a day.  The patient is on metoprolol, hydralazine, and telmisartan also. Would not give spironolactone secondary to chronic kidney disease.  2.  Hypertension. Blood pressure a little borderline during the hospital course, 165/64 upon discharge. Follow up as outpatient.  3.  Diabetes. Sugars are very labile here. Did have a sugar in the 30s. We actually held the patient's Lantus for a period of time then the sugars were up in the 400s. I started back up on 10 units daily. I am a little hesitant on going 10 units twice a day like  she was taking at home, but if her sugars continue to high may have to do that.  4.  Debility. Physical therapy recommended at home with home health.  5.  Depression. One of her major issues is being scared about her medical issues. I did start Celexa, which is weight neutral. Hopefully, that will help her out.  6.  I would be very hesitant about  digoxin with chronic kidney disease. Recommend checking a digoxin level as outpatient, potentially getting rid of that medication.  TIME SPENT ON DISCHARGE: 35 minutes.    ____________________________ Tana Conch. Leslye Peer, MD rjw:LT D: 10/05/2013 14:39:40 ET T: 10/05/2013 17:05:08 ET JOB#: [id number]  cc: Tana Conch. Leslye Peer, MD, <Dictator> Bethena Roys. Ancil Boozer, MD Corey Skains, MD Marisue Brooklyn MD ELECTRONICALLY SIGNED 10/18/2013 12:47

## 2014-04-24 NOTE — Discharge Summary (Signed)
PATIENT NAME:  Patricia Mccoy, Patricia Mccoy MR#:  [id number] DATE OF BIRTH:  [birth year]  DATE OF ADMISSION:  11/18/2013 DATE OF DISCHARGE:  11/20/2013  PRESENTING COMPLAINT: Shortness of breath.   DISCHARGE DIAGNOSES:  1.  Acute on chronic diastolic congestive heart failure, stable, saturation is more than 97% on room air.  2.  Atrial fibrillation, transient, resolved, now in sinus rhythm.  3.  Chronic kidney disease, stage III.  4.  Type 2 diabetes.   DISCHARGE INSTRUCTIONS:   1.  Home health PT.   2.  ADA 1800 calorie diet.  3.  Follow up with Dr. Ubaldo Glassing in 1-2 weeks.   CODE STATUS: Full code.   MEDICATIONS:  1. Amlodipine 5 mg b.i.d.   2. Omeprazole 20 mg b.i.d.  3. PreserVision 1 p.o. daily.    4. Folic acid 1 mg daily.  5. Aspirin 81 mg daily.  6. Methotrexate 2.5 mg 3 tablets once a week on Friday.  7. Prednisone 1 mg p.o. daily.  8. Atorvastatin 40 mg at bedtime.  9. NovoLog sliding per your sliding scale. 10. Telmisartan 80 mg b.i.d.  11. Sertraline 25 mg daily.  12. Hydralazine 10 mg t.i.d.  13. Clonazepam 1 mg half a tablet at bedtime.  14. Levemir 20 units at noon.  15. Metoprolol 100 mg extended release half a tablet daily.  16. Os-Cal 1 tablet daily.  17. Uloric 40 mg p.o. daily.  18. Fish oil p.o. daily.  19. Lasix 20 mg p.o. daily from November 23rd.   Home health PT, RN.   Follow up with Dr. Ancil Boozer in 1-2 weeks. The patient also will follow up with the CHF clinic.   CONDITION ON DISCHARGE: Fair. Vitals stable. Temperature is 97.9, pulse is 67. Blood pressure is 152/56, saturation is 95% on room air. Creatinine at discharge is 2.0. Potassium is 3.7. Glucose is 189. White count is 5.6. Cardiac enzymes x 2 were negative. B type natruretic peptide was 8382.    BRIEF SUMMARY OF HOSPITAL COURSE:  [name] is a 75 year old Caucasian female with past medical history of hypertension, diabetes, comes in with:   1.  Acute on chronic diastolic congestive heart. She was  started on IV Lasix, diuresed well, changed to p.o. Lasix down to 20 mg daily. Her creatinine is 2 at discharge, hence Lasix dose was decreased. Saturations are 97% on room air. She was diuresed more than 3 liters. She was seen by CHF clinic Darylene Price, RN, nurse practitioner and will follow up with them as well. 2.  Atrial fibrillation, RVR, transient, stable, now resolved.  Seen by Dr. Ubaldo Glassing, no indication for anticoagulation since in sinus rhythm. Continue metoprolol.  3.  CKD stage III. Creatinine remained stable, went up to 2. Her Lasix was decreased to 20 mg daily. She will follow up with Dr. Ancil Boozer and BMPwill be checked at her office.  4.  Type 2 diabetes.  sliding scale insulin 5.  Physical therapy recommended home PT which has been set up.   TIME SPENT: 40 minutes.    ____________________________ Hart Rochester Posey Pronto, MD sap:bu D: 11/20/2013 14:49:19 ET T: 11/20/2013 21:22:19 ET JOB#: [id number]  cc: Dorcus Riga A. Posey Pronto, MD, <Dictator> Bethena Roys. Ancil Boozer, [place] Jackelyn Hoehn, NP  Ilda Basset MD ELECTRONICALLY SIGNED 12/03/2013 14:07

## 2014-04-24 NOTE — Discharge Summary (Signed)
PATIENT NAME:  Patricia Mccoy, Patricia Mccoy MR#:  [id number] DATE OF BIRTH:  1940/02/05  DATE OF ADMISSION:  12/08/2013 DATE OF DISCHARGE:  12/11/2013  ADMISSION DIAGNOSES:  1.  Elevated troponin.  2.  Acute renal failure.  3.  Hyperglycemia.   DIAGNOSES AT DISCHARGE:  1.  Atrial fibrillation and rapid ventricular response.  2.  Acute respiratory failure with hypoxia due to acute on chronic diastolic heart failure.  3.  Acute on chronic renal failure.  4.  Acute on chronic diastolic heart failure.  5.  Elevated troponins.  6.  Hyponatremia.  7.  History of lymphoma.  8.  History of essential hypertension.   CONSULTATIONS:  1.  Dr. Nehemiah Massed.  2.  Palliative care.   LABORATORIES AT DISCHARGE: Sodium 125, potassium 4.7, chloride 90, bicarbonate 28, BUN 87, creatinine 4.31, glucose is 366.     PHYSICAL EXAMINATION AT DISCHARGE:   VITAL SIGNS:  Temperature 97.4, pulse is 69, respirations 22, blood pressure 142/48, 91% on room air.  GENERAL: The patient is alert, oriented, frail-appearing.  CARDIOVASCULAR: Irregularly irregular. There is a 3 out of 6 systolic ejection murmur heard best at the left sternal border.  LUNGS: Clear to auscultation. There are no crackles, rales, rhonchi or wheezing. Normal to percussion.  ABDOMEN: Bowel sounds positive. Nontender and nondistended.  No hepatosplenomegaly. EXTREMITIES:  No clubbing, cyanosis, edema.    HOSPITAL COURSE: This is a 74 year old female who presented with shortness of breath and hypoxia. For further details please refer to the H and P.   1.  Acute respiratory failure with hypoxia due to acute on chronic diastolic heart failure. The patient was treated for acute on chronic diastolic heart failure as outlined below. She no longer is requiring oxygen, but she will be discharged with hospice and will therefore be on p.r.n. oxygen. 2.  Atrial fibrillation with RVR.  Heart rates have much improved. She was initially placed on the unit due to her  elevated heart rate. She is asymptomatic from her heart rate. The patient actually wanted to be discharged home with hospice. She understands that she will have acute respiratory failure due to her acute renal failure if her heart rates are uncontrolled, but she does desire to be home with hospice. She had echocardiogram in August 2015 which showed a preserved ejection fraction, but she does have diastolic dysfunction. No anticoagulation at this time as per cardiology and the patient's wishes.  3.  Acute on chronic diastolic heart failure secondary to atrial fibrillation, RVR. The patient was on Lasix, transitioned to p.o. Lasix.  4.  Acute renal failure on chronic renal failure.  Her baseline creatinine is 2 and it is now up to 4.   She has fluid overload on chest x-ray.  She understands that she is high risk of ongoing fluid overload.  Her creatinine did slightly improve, but the patient did want hospice and she realizes her creatinine has worsened.  5.  Elevated troponin. Her troponins were actually flat, this was not acute coronary syndrome but demand ischemia from the above issues.  6.  Hyponatremia from heart failure, which portends a poor prognosis.  7.  Hypokalemia, was repleted p.r.n.  8.  History of lymphoma. The patient is on prednisone daily.   DISCHARGE MEDICATIONS:  1. Omeprazole 20 mg b.i.d.   2. PreserVision 1 tablet b.i.d.  3. Folic acid 1 mg daily.  4. Aspirin 81 mg daily.  5. Methotrexate 2.5 three tablets on Friday.  6. Prednisone 1 mg daily.  7. Atorvastatin 40 mg daily.  8. NovoLog sliding scale.  9. Clonazepam 1 mg half tablet at bedtime.  10. Levemir 20 units in the a.m.  11. Os-Cal 1 tablet daily.  12. Fish oil 1 tablet daily.  13. Lasix 20 mg every day, start from Monday, November 23.  14. Metoprolol 50 b.i.d.   DISCHARGE OXYGEN: 2 liters nasal cannula p.r.n.   Discharge Glucerna 3 times a day.   DISCHARGE ACTIVITY: As tolerated.   DISCHARGE REFERRAL: Hospice.  The patient is being discharged to home with hospice as per her wishes.   TIME SPENT: About 40 minutes.     ____________________________ Donell Beers. Benjie Karvonen, MD spm:bu D: 12/11/2013 13:04:03 ET T: 12/11/2013 20:36:58 ET JOB#: [id number]  cc: Cait Locust P. Benjie Karvonen, MD, <Dictator> Bethena Roys. Ancil Boozer, MD Donell Beers Gemini Beaumier MD ELECTRONICALLY SIGNED 12/11/2013 21:47

## 2014-04-24 NOTE — H&P (Signed)
PATIENT NAME:  Patricia Mccoy, Patricia Mccoy MR#:  [id number] DATE OF BIRTH:  29-May-1940  DATE OF ADMISSION:  11/18/2013  PRIMARY CARE PHYSICIAN: Steele Sizer, MD  HISTORY OF PRESENT ILLNESS:  The patient is a 74 year old Caucasian female with past medical history significant for history of diastolic congestive heart failure, according to 08/2013 echocardiogram, who presents to the hospital with complaints of significant weight gain.  Apparently, the patient was doing well up until today morning when she woke up.  She weighed herself and she was weighing around 110 pounds up from 106 yesterday, x 4 pounds.  Because she was not feeling well, had some increasing heart palpitations, she decided to go to urgent care. She was seen by Dr. Posey Pronto at the urgent care, who ordered EKG.  The EKG revealed atrial fibrillation, RVR, at a rate of 110. She was admitted to the hospital as directed, as the patient's chest x-ray also revealed worsening pleural effusion, left more than the right.  The patient herself is complaining of shortness of breath, which usually happens whenever she lies down. She also admits of having some chest pains which happened today in the hospital in the ER lasting approximately couple minutes. Pain was described as dull, lasting a few minutes with no radiation not increasing with moving around.  Here in the hospital, she is not in atrial fibrillation any more. She is in sinus rhythm at the rate of 80.   PAST MEDICAL HISTORY: Significant for history of recent admission to the hospital about 01/04/2013.  At that time, the patient had a left thoracentesis done.  Admission was for acute on chronic diastolic congestive heart failure, hypertension, diabetes, debility as well as glaucoma. History also significant for chronic kidney disease stage IV, 1 kidney, anemia of chronic disease, diabetes mellitus, osteoarthritis, chronic atrial fibrillation, breast cancer status post left-sided lumpectomy, chemotherapy and  radiation therapy, history of bursitis, peripheral vascular disease, osteoporosis, gout, peptic ulcer disease, gastroesophageal reflux disease.   PAST SURGICAL HISTORY: Femoral stent placement for peripheral vascular disease, renal artery stent placement, a right lumpectomy for lymph node reduction.   ALLERGIES: AMBIEN, CODEINE, CONTRAST DYE, KEFLEX AND MAXZIDE, MORPHINE AND PENICILLIN AS WELL AS SULFA DRUGS AND ZESTRIL.   MEDICATIONS: According to medical records, the patient is on amlodipine 5 mg twice daily, aspirin 81 mg p.o. daily, atorvastatin 40 mg p.o. daily, clonazepam 0.5 mg p.o. at bedtime, folic acid 1 mg p.o. daily, furosemide 40 mg p.o. twice daily,  hydralazine 10 mg p.o. twice daily, Levemir 20 units subcutaneously daily, methotrexate 2.5 mg 3 tablets once weekly, metoprolol 50 mg p.o. daily, NovoLog sliding scale, omeprazole 10 mg p.o. daily, Os-Cal 500 mg with vitamin D once daily, prednisone 1 mg p.o. daily, PreserVision 1 tablet twice daily, Sertraline 25 mg p.o. daily for first 2 weeks and then 1 pill daily.  It is unclear how long she has been taking this medication. Also  Urolic acid 80 mg p.o. daily.   SOCIAL HISTORY: Quit smoking 30 years before. No alcohol abuse, illicit drug abuse, lives by herself. She had a daughter who visits her a few times a week.   FAMILY HISTORY: Mother coronary artery disease as well as stroke.   REVIEW OF SYSTEMS: Positive for weight gain, approximately 4 pounds since yesterday, admits of left-sided chest pain as well as palpitations and she does have bifocal glasses.  She has difficulty with vision and wheezes intermittently, also shortness of breath.  Arrhythmias earlier today. Some chest pains when she  came to the hospital here and some intermittent diarrheal stools; also rectal leakage and incontinence.   She has 1 kidney, has 25% of the other kidney function.  She wakes up 3 or 4 times at night to urinate.  She admits having urgent incontinence.   Also admits of losing muscle, not eating well. Denies any fevers, chills, fatigue, weakness, weight loss,  denies any blurry vision, double vision, glaucoma or cataracts.  REVIEW OF SYSTEMS:   ENT:  Denies tinnitus, allergies, epistaxis, sinus pain, dentures, difficulty swallowing. No cough, hemoptysis, asthma, chronic obstructive pulmonary disease.  CARDIOVASCULAR: Denies orthopnea, edema, palpitations or syncope.  GASTROINTESTINAL: Denies any nausea, vomiting, hematemesis, rectal bleeding, change in bowel habits.  GENITOURINARY: Denies any dysuria, hematuria, frequency, incontinence.   ENDOCRINE: Denies any polydipsia,  thyroid problems, heat or cold intolerance or thirst.  HEMATOLOGIC: Denies anemia, easy bruising, bleeding, swollen glands.  SKIN: Denies any acne or change in moles.  MUSCULOSKELETAL: Denies arthritis, swelling, redness, or tremor.  PSYCHIATRIC: Denies anxiety, insomnia, or depression.   PHYSICAL EXAMINATION:  VITAL SIGNS: On arrival to the hospital, temperature is 97.5, pulse was 80, respirations were 20, blood pressure 146/63 and saturation was 97% on room air.  GENERAL:  A well-developed, well-nourished Caucasian female in no significant distress   HEENT: Her pupils equal and reactive to light. Extraocular muscles intact. There is normal hearing. No pharyngeal erythema. Mucosa is moist.  NECK: No masses. Supple, nontender. Thyroid is not enlarged. No adenopathy. No JVD or carotid bruits bilaterally. Full range of motion.  LUNGS: Crackles at bases, and dullness to percussion at bases, but not in  overt respiratory distress.  CARDIOVASCULAR: S1, S2 appreciated and regular. The patient does have systolic murmur 4 to 6 at the apex auscultation area and no radiation.  Chest is nontender to palpation with palpable pulses.  1+ lower extremity edema.  No calf tenderness or cyanosis was noted.  ABDOMEN: Soft, nontender, minimal discomfort was noted in the right lower quadrant area,  but no rebound or guarding were noted. No hepatosplenomegaly or masses were noted.  RECTAL: Deferred.  MUSCLE STRENGTH: Able to move all extremities. No cyanosis, degenerative joint disease. The patient does have some kyphosis. Gait is not tested.  SKIN: Did not reveal any rashes, lesions, erythema, nodularity or induration. It was warm and dry to palpation.  LYMPHATIC: No adenopathy in the cervical region.  NEUROLOGICAL: Cranial nerves grossly intact. Sensory intact. No dysarthria or aphasia. The patient is alert, oriented to time, person and place, cooperative.  Memory is good, no confusion, agitation, or depression noted.   LABORATORY DATA:  BMP showed a glucose of 288. BUN and creatinine were 34 and 1.88, sodium 131, beta-type natriuretic peptide was 8382.  Liver enzymes. alkaline phosphatase 129, otherwise unremarkable.  Cardiac enzymes first set negative.  CBC, White blood cell count 8.1, hemoglobin 11.9, platelet count 315,000.  Absolute neutrophil count is normal at 6.4. Coagulation panel was unremarkable.   RADIOLOGIC STUDIES: Chest x-ray today on 11/18/2013, showed a continued left greater than right pleural effusions, underlying chronic lung disease. No superimposed acute findings were found.  EKG done in urgent care showed atrial fibrillation at a rate of 107, normal axis. No acute ST-T changes were noted.   DIAGNOSTIC DATA: Telemetry here in the hospital revealed sinus rhythm, rate was in 80s to 90s.   ASSESSMENT AND PLAN:  1.  Acute on chronic diastolic congestive heart failure.  Admit the patient to the medical floor, continue her on Hydralazine,  Imdur, and Lasix IV.  The patient may need thoracentesis again. 2.  Atrial fibrillation, rapid ventricular response.   We will continue metoprolol.  We will get cardiologist involved and we will try to advance beta blockers. 3.  Chronic kidney disease, follow up with her diuretic therapy.  4.  Diabetes mellitus. We will continue Levemir as  well as sliding scale insulin.   TIME SPENT: 50 minutes on the patient.     ____________________________ Theodoro Grist, MD rv:DT D: 11/18/2013 14:26:50 ET T: 11/18/2013 15:15:02 ET JOB#: [id number]  cc: Theodoro Grist, MD, <Dictator> Bethena Roys. Ancil Boozer, MD  Theodoro Grist MD ELECTRONICALLY SIGNED 12/24/2013 20:47

## 2014-04-24 NOTE — H&P (Signed)
PATIENT NAME:  Patricia Mccoy, Patricia Mccoy MR#:  [id number] DATE OF BIRTH:  [birth year]  DATE OF ADMISSION:  10/01/2013  PRIMARY CARE PHYSICIAN: Bethena Roys. Ancil Boozer, MD    REFERRING PHYSICIAN: Dr. Owens Shark     CHIEF COMPLAINT: Shortness of breath.     HISTORY OF PRESENT ILLNESS: Patricia Mccoy, a 74 year old female, with known history of diastolic congestive heart failure with EF of 55% comes to the Emergency Department with complaints of shortness of breath. The patient states he was doing well until this afternoon. Started to experience severe shortness of breath. The patient states developed mild cough, then developed shortness of breath, however, prior to that did not have any cough or fever. The patient was in severe respiratory distress at the time of the presentation to the Emergency Department. The patient was initially placed on BiPAP, was given Lasix and breathing treatments with improvement and currently on 2 liters of nasal cannula with good oxygen saturations. The patient denied having any chest pain, denied having any PND, orthopnea. Did not notice any increased swelling in the lower extremities. The patient denies any recent travel. The patient states drinks a significant amount of water on a daily basis.   PAST MEDICAL HISTORY:  1.  Chronic diastolic congestive heart failure with EF of 55%.   2.  Chronic kidney disease of stage IV.  3.  Anemia of chronic disease.  4.  Diabetes mellitus.  5.  Osteoarthritis.  6.  Chronic atrial fibrillation.  7.  Breast cancer, status post lumpectomy, chemotherapy and radiation.  8.  History of bursitis.  9.  Peripheral vascular disease.  10.  Osteoporosis.  11.  Gout.  12.  Peptic ulcer disease.  13.  Gastroesophageal reflux disease.    PAST SURGICAL HISTORY:  1.  Femoral stent placement for PVD.  2.  Renal artery stent placement.  3.  Right lumpectomy with lymph node resection.     ALLERGIES:  1.  AMBIEN.  2.  CODEINE.  3.  CONTRAST DYE.  [place].  6.  MORPHINE.  7.  PENICILLIN.  8.  SULFA DRUGS.  9.  ZESTRIL.    HOME MEDICATIONS:  1.  Uloric 80 mg once a day.  2.   80 mg once a day.   3.  Prednisone 1 mg once a day.  4.  Os-Cal 500 mg once a day.   5.  Omeprazole 20 mg 3 times a day.  6.  Mirtazapine 15 mg once a day.   7.  Metoprolol 100 mg 2 times a day.  8.  Methotrexate 2.5 mg 3 tablets once a day.  9.  Lantus 10 units 2 times a day.  10.  Hydralazine 10 mg 3 times a day.  11.  Lasix 40 mg 2 times a day.   12.  Folic acid 1 mg daily.  13.  Digoxin 125 mcg every other day.  14.  Clonazepam 0.5 mg at bedtime.   15.   mg once a day.  16.  Atorvastatin 40 mg once a day.  17.  Aspirin 81 mg daily.  18.  Amlodipine 5 mg 2 times a day.   SOCIAL HISTORY: Quit smoking 30 years back. Denies drinking alcohol or using illicit drugs. Lives by herself. Has a daughter who visits her 2 times a week.   FAMILY HISTORY: Significant for mother with coronary artery disease and a stroke.   REVIEW OF SYSTEMS:   CONSTITUTIONAL: Denies any generalized  weakness.  EYES: No change in vision.  EARS, NOSE AND THROAT: No change in hearing.  RESPIRATORY: Has shortness of breath.  CARDIOVASCULAR: No chest pain, palpations.  GASTROINTESTINAL: No nausea, vomiting, abdominal pain.  GENITOURINARY: No dysuria or hematochezia.   HEMATOLOGIC: No easy bruising or bleeding.  SKIN: No rash or lesions.  MUSCULOSKELETAL: No joint pains and aches.  NEUROLOGIC: No weakness or numbness in any part of the body.   PHYSICAL EXAMINATION:  GENERAL: This is a frail-looking female lying down in the bed, not in distress.  VITAL SIGNS: Temperature 97.6, pulse 91, blood pressure 172/62, respiratory rate of 20, oxygen saturation 95% on 2 liters of oxygen.  HEENT: Head normocephalic, atraumatic. No scleral icterus. Conjunctivae normal. Pupils equal and react to light. Mucous membranes moist. No pharyngeal erythema.  NECK: Supple. No lymphadenopathy.  No JVD. No carotid bruit. No thyromegaly.  CHEST: No focal tenderness. Decreased breath sounds in the right side of the chest. Left side clear to auscultation.  HEART: S1, S2 regular. No murmurs are heard.  ABDOMEN: Bowel sounds plus. Soft, nontender, nondistended. No hepatosplenomegaly.  EXTREMITIES: One to 2+ pitting edema extending up to the knees.  NEUROLOGIC: The patient is alert, oriented to place, person, and time. Cranial nerves II through XII intact. Motor 5/5 in upper and lower extremities.   LABORATORY DATA: UA negative for nitrites and leukocyte esterase. Chest x-ray, 1 view, portable: Cardiomegaly with mild interstitial edema.   A CT chest without contrast shows cardiac enlargement with probable interstitial pulmonary edema, diffuse emphysematous changes of the lungs, atelectasis and consolidation in the lower lobes, post-inflammatory changes .   BMP: BUN 26, creatinine of 1.85, potassium of 3.2. The rest of the values are within normal limits.   CBC is completely within normal limits. Initial set of cardiac enzymes are negative.   ASSESSMENT AND PLAN: Patricia Mccoy is a 74 year old female who comes to the Emergency Department with congestive heart failure.  1.  Congestive heart failure, diastolic, acute on chronic. Continue with the Lasix. Even though patient shows questionable consolidation in the lower lobes, this seems to be more from the pulmonary edema. The patient does not have any fever, no elevated white blood cell count, without any left shift and we will hold any antibiotics.  2.  Debility. We will involve physical therapy, occupational therapy.  3.  Diabetes mellitus. Continue with the Lantus at home dose.  4.  Chronic renal insufficiency, stable at baseline.  5.  Right-sided pleural effusion, seems to be more of chronic. The patient had recent multiple rib fractures. The patient denies any recent weight loss. CT chest shows loculated effusion.  6.  Keep the patient on  deep vein thrombosis prophylaxis with Lovenox.   TIME SPENT: 55 minutes.   ____________________________ Monica Becton, MD pv:AT D: 10/01/2013 05:06:45 ET T: 10/01/2013 06:24:43 ET JOB#: [id number]  cc: Monica Becton, MD, <Dictator> Bethena Roys. Ancil Boozer, MD Monica Becton MD ELECTRONICALLY SIGNED 10/02/2013 23:00

## 2014-04-24 NOTE — Consult Note (Signed)
PATIENT NAME:  Patricia Mccoy, Patricia Mccoy MR#:  [id number] DATE OF BIRTH:  [birth year]  DATE OF CONSULTATION:  12/09/2013  REFERRING PHYSICIAN:   CONSULTING PHYSICIAN:  Corey Skains, MD  CONSULTING PHYSICIAN:  Dr. Volanda Napoleon.   REASON FOR CONSULTATION: Atrial fibrillation with rapid ventricular rate with acute on chronic diastolic dysfunction congestive heart failure with elevated troponin.   CHIEF COMPLAINT:  "I'm short of breath."   HISTORY OF PRESENT ILLNESS: This is a 74 year old female with known diastolic dysfunction heart failure with acute on chronic diastolic dysfunction heart failure and pulmonary edema with bronchitis and upper respiratory tract infection.  The patient also has exacerbating issues including chronic kidney disease stage IV, anemia with a hemoglobin of 9.6.  She does have an elevated troponin of 0.49 more consistent with kidney disease rather than acute coronary syndrome. Additionally the patient has had with her infection now atrial fibrillation paroxysmal and nonvalvular in nature, acute on chronic with rapid ventricular rate. She has had shortness of breath and weakness with this.  The remainder of review of systems cannot be assessed due to the patient's illness and difficulty.   PAST MEDICAL HISTORY:  1.  Paroxysmal nonvalvular atrial fibrillation.  2.  Essential hypertension.  3.  Anemia.  4.  Chronic kidney disease stage IV.  5.  Diastolic dysfunction heart failure.   FAMILY HISTORY:  No family members with early onset of cardiovascular disease or hypertension.   SOCIAL HISTORY: She currently denies alcohol or tobacco use.   ALLERGIES: AS LISTED.   MEDICATIONS: As listed.   PHYSICAL EXAMINATION:  VITAL SIGNS: Blood pressure is 110/68 bilateral, heart rate is 120 upright, reclining, and irregular.  GENERAL: She is a well-appearing female in moderate distress.  HEAD, EYES, EARS, NOSE, THROAT: No icterus, thyromegaly, ulcers, hemorrhage, or xanthelasma.   CARDIOVASCULAR: Rapid ventricular rate and irregular with normal S1 and S2 with a 2-3 out of 6 apical murmur consistent with mitral regurgitation. PMI is inferiorly displaced. Carotid upstroke normal without bruit. Jugular venous pressure is normal.  LUNGS: Have expiratory wheezes and rhonchi with few basilar rales.  ABDOMEN: Soft, nontender, without hepatosplenomegaly or masses. Abdominal aorta normal size without bruit.  EXTREMITIES: Showed 2 + radial, trace femoral and dorsal pedal pulses with trace lower extremity edema. No cyanosis, clubbing, ulcers.  NEUROLOGIC: She is oriented to time, place, and person, with normal mood and affect.   ASSESSMENT: A 74 year old female with nonvalvular acute paroxysmal atrial fibrillation with rapid ventricular rate likely secondary to recent infection, hypoxia, and acute on chronic diastolic dysfunction congestive heart failure exacerbated by chronic kidney disease stage IV and anemia with an elevated troponin, not currently consistent with acute coronary syndrome.   RECOMMENDATIONS:  1.  Continue current metoprolol for heart rate control of atrial fibrillation and likely transfer to the ICU for diltiazem drip for heart rate control while the patient is improving from infection.  2.  Diuretic including intravenous Lasix for current concerns of heart failure and edema with following concerns of chronic kidney disease.  3.  Follow closely for anemia and concerns of anticoagulation with atrial fibrillation due to anemia, fall risk, and other bleeding risk, and will reevaluate.  4.  No further intervention of elevated troponin more consistent with current illness and chronic kidney disease.  5.  After adjustments of medication management for atrial fibrillation would further consider the possibility of electrical cardioversion if needed due to diastolic dysfunction heart failure.    ____________________________ Corey Skains, MD bjk:bu D: 12/09/2013  13:30:05 ET T: 12/09/2013 14:00:01 ET JOB#: [id number]  cc: Corey Skains, MD, <Dictator> Corey Skains MD ELECTRONICALLY SIGNED 12/11/2013 7:55

## 2014-04-24 NOTE — Consult Note (Signed)
PATIENT NAME:  Patricia Mccoy, THOENNES MR#:  [id number] DATE OF BIRTH:  1940-06-03  DATE OF CONSULTATION:  10/01/2013  REFERRING PHYSICIAN:  Monica Becton, MD.  CONSULTING PHYSICIAN:  Youa Deloney D. Clayborn Bigness, MD  PRIMARY CARE PHYSICIAN:  Steele Sizer, MD.  INDICATION:  Shortness of breath.   HISTORY OF PRESENT ILLNESS:  The patient is a 74 year old female with known diastolic congestive heart failure, EF 55%, came to the Emergency Room with complaint of shortness of breath. The patient states she has been doing well up until the afternoon when she started experiencing severe shortness of breath, dyspnea. Developed a mild cough. She did not really notice any significant fever. The patient said she has had respiratory distress  came to Emergency Room, placed on BiPAP, given Lasix which seemed to help her significantly.  She was placed on 3 liters nasal cannula. She denied significant chest pain, denied PND or orthopnea, was in the hospital recently for a fall and then  several week ago with shortness of breath. She did drink a lot of water, it is not clear why. She had some leg swelling and shortness of breath and now presents for further evaluation.   PAST MEDICAL HISTORY: Chronic diastolic heart failure, EF 55%; renal insufficiency stage IV, anemia, diabetes, osteoarthritis, chronic atrial fibrillation, history of breast cancer status post chemotherapy and radiation after lumpectomy, bursitis, peripheral vascular disease, osteoporosis, gout,   PAST SURGICAL HISTORY: Peripheral vascular disease, femoral artery stent. She has had renal artery stent placement,  radiation and chemotherapy.  ALLERGIES: AMBIEN, CODEINE, IV CONTRAST, KEFLEX, MAXZIDE, MORPHINE, PENICILLIN, SULFA, ZESTRIL.   HOME MEDICATIONS:  Uloric 80 mg once a day, prednisone 1 mg a day, Os-Cal 500 mg once a day, omeprazole 20 mg 2 times a day, mirtazapine 50 mg once a day, metoprolol 100 mg twice a day, methotrexate 2.5   once a day, Lantus  10 units   times a day, hydralazine 10 mg  times daily,  40 mg twice a day, folic acid 1 mg daily, digoxin 0.125 mg daily, clonazepam 0.5 mg at bedtime, atorvastatin 40 mg once a day, aspirin 81 mg a day, amlodipine 5 mg twice a day.   SOCIAL HISTORY: Quit smoking, 30 pack-year history.  No drug use. Lives by herself, her daughter visits regularly.   FAMILY HISTORY: Coronary disease, stroke.   REVIEW OF SYSTEMS:  Denies blackout spells, syncope. Denies nausea, vomiting. Denies fever, chills, sweats. No weight loss, no weight gain. No hemoptysis or hematemesis. Denies bright red blood per rectum. No vision change or hearing change. Denies sputum production. She has had some cough with shortness of breath. Denies any chest pain, denies any skin rash. No polydipsia, polyuria.   PHYSICAL EXAMINATION: VITAL SIGNS: Blood pressure initially was 170/60, pulse 90 and regular, respiratory rate 18, afebrile.  HEENT: Normocephalic, atraumatic. Pupils equal, reactive to light.  NECK: Supple. No significant JVD, bruits or adenopathy.  LUNGS: Clear. Mild rhonchi no rales.  HEART: Regular rate and rhythm. Systolic ejection murmur at the apex. Positive S4.  ABDOMEN: Benign.  EXTREMITIES: within normal limmits  LABORATORY DATA:  BNP 26, creatinine 1.85, potassium 3.2, otherwise negative. CBC within normal limits. Cardiac enzymes are negative.   Urinalysis was negative.    Chest x-ray: Cardiomegaly, otherwise negative.   CT chest was negative.   ASSESSMENT: Congestive heart failure, shortness of breath, diastolic dysfunction, hypertension, diabetes, renal insufficiency. History of right pleural effusion, history of atrial fibrillation, reflux, arthritis symptoms.   PLAN: Agree with admit.  Treat for respiratory difficulty, BiPAP initially and inhalers as well as supplemental oxygen. Lasix also for mild congestive heart failure. Supplemental potassium for hypokalemia. Also continue aggressive medical therapy  for renal insufficiency. Consider nephrology consult, avoid nephrotoxic drugs. Follow up anemia. Continue treatment for arthritis. Chronic atrial fibrillation appears to be reasonably controlled. Consider anticoagulation. Continue rate control and blood pressure control with metoprolol and hydralazine. For diabetes continue Lantus therapy.  For reflux agree with omeprazole therapy. We will try to treat the patient medically and conservatively for now as long as she improves symptomatically. Base further workup on her response to therapy.  Replace potassium as you are doing.    ____________________________ Loran Senters. Clayborn Bigness, MD ddc:lt D: 10/02/2013 09:05:44 ET T: 10/02/2013 10:27:47 ET JOB#: [id number]  cc: Teejay Meader D. Clayborn Bigness, MD, <Dictator> Yolonda Kida MD ELECTRONICALLY SIGNED 11/04/2013 10:30

## 2014-04-24 NOTE — Consult Note (Signed)
PATIENT NAME:  Patricia Mccoy, CANCEL MR#:  [id number] DATE OF BIRTH:  12/29/1940  DATE OF CONSULTATION:  08/30/2013  REFERRING PHYSICIAN:    CONSULTING PHYSICIAN:  Corey Skains, MD  REASON FOR CONSULTATION: Acute on chronic systolic dysfunction heart failure with elevated troponin, chronic kidney disease, atrial fibrillation, diabetes and valvular heart disease.   CHIEF COMPLAINT: Shortness of breath.   HISTORY OF PRESENT ILLNESS: This is a 74 year old female with known systolic dysfunction in the past with an ejection fraction of 40% who has had chronic kidney disease with a creatinine of 1.9, history of atrial fibrillation remaining in normal sinus rhythm on digoxin and metoprolol with diabetes and mitral valve disease with acute lower extremity edema and shortness of breath, weakness, fatigue and hypoxia. She was seen in the Emergency Room with an EKG showing a normal sinus rhythm with nonspecific ST changes [place] a BNP of [address] and a chest x-ray consistent with pulmonary edema. She was given Lasix which has helped with some of her shortness of breath and now improving. The patient does have a history of systolic dysfunction, on appropriate medication management. After discussion, she does have some dietary indiscretion which may contribute to the above. The patient does have an elevated troponin of 2, consistent with subendocardial myocardial infarction and/or NSTEMI due to likely the heart failure.   REVIEW OF SYSTEMS: The remainder of the review of systems negative is negative for vision change, ringing in the ears, hearing loss, cough, congestion, heartburn, nausea, vomiting, diarrhea, bloody stool, stomach pain, extremity pain, leg weakness, cramping of the buttocks, known blood clots, bloody stools, frequent urination, urination at night, muscle weakness, numbness, anxiety, depression, skin lesions or skin rashes.   PAST MEDICAL HISTORY:  1.  Systolic dysfunction heart failure.  2.  Chronic  kidney disease.  3.  Atrial fibrillation.  4.  Diabetes.  5.  Mitral valve disease.   FAMILY HISTORY: No family members with early onset of cardiovascular disease or hypertension.   SOCIAL HISTORY: Currently denies alcohol or tobacco use.   ALLERGIES: As listed.   MEDICATIONS: As listed.   PHYSICAL EXAMINATION:  VITAL SIGNS: Blood pressure 110/68 bilaterally, heart rate 70 upright reclining and regular.  GENERAL: She is a well-appearing elderly female, in no acute distress.  HEENT: No icterus, thyromegaly, ulcers, hemorrhage or xanthelasma.  CARDIOVASCULAR: Regular rate and rhythm with an inferiorly displaced PMI with a 2 to 3 MR murmur. Carotid upstroke normal. Jugular venous pressure is normal.  LUNGS: Have bibasilar crackles with decreased breath sounds.  ABDOMEN: Soft, nontender without hepatosplenomegaly or masses. Abdominal aorta is normal size without bruit.  EXTREMITIES: Show 2+ radial, trace femoral and dorsal pedal pulses with 1+ lower extremity edema with some oozing. No cyanosis or clubbing.   NEUROLOGIC: She is oriented to time, place and person with normal mood and affect.   ASSESSMENT:  This is a 74 year old female with chronic kidney disease, atrial fibrillation remaining in normal sinus rhythm, diabetes, mitral valve disease with acute on chronic systolic dysfunction congestive heart failure with elevated BNP and hypoxia with elevated troponin consistent with subendocardial myocardial infarction and non-ST-elevation myocardial infarction.   RECOMMENDATIONS:  1.  Heparin for 24 to 48 hours for acute NSTEMI, likely secondary to acute hypoxia and congestive heart failure.  2.  Beta blocker and ACE inhibitor as able for further treatment of congestive heart failure, acute on chronic, systolic.  3.  Echocardiogram for evaluation of worsening congestive heart failure, LV systolic dysfunction contributing to above  with adjustments of medications.  4.  Diet control and dietary  assessment and treatment due to concerns of dietary causes of above.  5.  Continue beta blocker, metoprolol and digoxin for heart rate control and maintenance of normal sinus rhythm with history of atrial fibrillation.  6.  Intravenous Lasix for pulmonary edema and congestive heart failure, watching for worsening chronic kidney disease.  7.  Diabetes medication management for a goal hemoglobin A1c below 7.  8.  Further diagnostic testing and treatment options after above and would consider cardiac catheterization if further concerns arise for acute NSTEMI and further treatment options for congestive heart failure.   ____________________________ Corey Skains, MD bjk:jb D: 08/30/2013 07:21:04 ET T: 08/30/2013 07:41:14 ET JOB#: [id number]  cc: Corey Skains, MD, <Dictator> Corey Skains MD ELECTRONICALLY SIGNED 08/31/2013 8:00

## 2014-04-24 NOTE — Discharge Summary (Signed)
PATIENT NAME:  Patricia Mccoy, Patricia Mccoy MR#:  [id number] DATE OF BIRTH:  02-10-1940  DATE OF ADMISSION:  08/30/2013 DATE OF DISCHARGE:  09/01/2013  CHIEF COMPLAINT AT THE TIME OF ADMISSION: Shortness of breath and worsening of lower extremity edema.   ADMITTING DIAGNOSES:  1.  Worsening lower extremity edema and shortness of breath.  2.  Elevated troponin.  3.  Chronic kidney disease.  PRIMARY DISCHARGE DIAGNOSES: Acute diastolic congestive heart failure with elevated troponin, probably from congestive heart failure and acute on chronic renal insufficiency. Left lower extremity wound improved with clindamycin. We will continue clindamycin after discharge.   FOLLOWUP: The patient is to follow up with outpatient [facility].   CONSULTATIONS: Cardiology, Corey Skains, MD  PROCEDURES: None.   BRIEF HISTORY AND PHYSICAL AND HOSPITAL COURSE: The patient is a 74 year old female with known history of congestive heart failure and mitral regurgitation who came into the ED with a chief complaint of worsening of lower extremity edema. Please review history and physical for details. Chest x-ray has revealed persistent bilateral pleural effusions and BNP was at 16,900. The patient is admitted to the hospital with the chief diagnosis of acute exacerbation of CHF, systolic in nature. The patient was started on IV Lasix 40 mg q. 12 hours and Dr. Nehemiah Massed was consulted. Initially, the thought process was to start the patient on a heparin drip in view of elevated troponin, but subsequently, the patient was chest pain free and troponins were trending down, so the elevated troponin seemed to be from demand ischemia from CHF exacerbation and worsening of renal function. The patient was placed on heparin subcutaneous q. 8 hours for DVT prophylaxis. Her lower extremity edema and weeping is significantly improved with IV diuresis. Dr. Nehemiah Massed has just recommended medical management, to continue aspirin, statin and  beta blocker. The plan is to continue Lasix and follow up with [facility] as an outpatient.   For acute on chronic renal insufficiency, her renal function is monitored closely, just maintain close to her baseline.   The patient had left lower extremity wound. The patient was placed on IV clindamycin and wound care was provided. Weeping ulcers were resolved and wound with less erythema and granulation tissue was found. The patient was recommended to continue with p.o. clindamycin and to follow up with outpatient [facility].   She has a history of breast cancer. We have recommended the patient to follow up with breast cancer doctor as an outpatient for surveillance. The patient reported that she had a normal mammogram in July. Overall, her clinical condition has significantly improved and the decision is made to discharge the patient home in stable condition.   MEDICATIONS AT THE TIME OF DISCHARGE: Amlodipine 5 mg 1 tablet p.o. 2 times a day, omeprazole 20 mg p.o. 2 times a day, mirtazapine 50 mg 1 tablet p.o. once daily in the evening, PreserVision 1 tablet p.o. 2 times a day, clonazepam 1 mg half tablet p.o. daily at bedtime, digoxin 125 mcg 1 tablet p.o. every other day in a.m., folic acid 1 mg p.o. every other day in a.m., Uloric 80 mg 1 tablet p.o. once daily, aspirin 81 mg p.o. daily in a.m., stool softener 1 tablet p.o. b.i.d., Os-Cal with vitamin D 1 tablet p.o. daily a.m., methotrexate 2.5 mg 3 tablets p.o. once a day in a.m., Bactroban 2% ointment apply topically 3 times a day for 10 days, Lantus at 10 units subcutaneously 2 times a day, metoprolol 100 mg 1  tablet p.o. 2 times a day, furosemide 40 mg p.o. 2 times a day in a.m., potassium chloride 20 mg p.o. once daily, clindamycin 300 mg p.o. 3 times a  day, probiotic oral capsule 1 capsule p.o. once daily.  ACTIVITY: As tolerated.   DIET: Low-fat, low-cholesterol and carbohydrate controlled.  FOLLOWUP: With primary care physician in a  week. Dr. Nehemiah Massed in a week and the patient was recommended to follow up with outpatient [facility].   Echocardiogram has revealed a left ventricular ejection fraction of 55%. The patient's BUN is at 39 and creatinine is at 1.9, which remained the same during the hospital course, which is her baseline. Hemoglobin 12.9, hematocrit 40.0, platelets are 344,000. Troponin 2.00, trended down to 1.70 and subsequently to 1.40.   The plan of care was discussed with the patient and her daughter at bedside. They both verbalized understanding of the plan.  TOTAL TIME SPENT ON THE DISCHARGE: Forty-five minutes.   ____________________________ Nicholes Mango, MD ag:TT D: 09/02/2013 15:11:18 ET T: 09/02/2013 22:46:23 ET JOB#: [id number]  cc: Nicholes Mango, MD, <Dictator> Corey Skains, MD Primary Care Physician  Nicholes Mango MD ELECTRONICALLY SIGNED 09/12/2013 15:33 Nicholes Mango MD ELECTRONICALLY SIGNED 09/12/2013 16:22

## 2014-04-24 NOTE — Discharge Summary (Signed)
PATIENT NAME:  Patricia Mccoy, Patricia Mccoy MR#:  [id number] DATE OF BIRTH:  May 18, 1940  DATE OF ADMISSION:  09/05/2013 DATE OF DISCHARGE:  09/06/2013  For a detailed note, please see the history and physical done on admission by Dr. Tressia Miners.   DIAGNOSES AT DISCHARGE:  1. Status post fall and right-sided rib fractures.  2. History of congestive heart failure.  3. Chronic atrial fibrillation.  4. Lower extremity cellulitis, now resolved.  5. Diabetes.  6. Gastroesophageal reflux disease.   DISCHARGE DIET: The patient is being discharged on a low-sodium, low-fat, American Diabetic Association diet.   ACTIVITY: As tolerated.   FOLLOWUP: With her primary care physician, Dr. Steele Sizer, in the next 1 to 2 weeks.   DISCHARGE MEDICATIONS: Amlodipine 5 mg b.i.d., omeprazole 20 mg b.i.d., Remeron 15 mg at bedtime, PreserVision 1 tablet b.i.d., Klonopin 1 mg tablet 1/2 tablet at bedtime, digoxin 125 mcg every other day, folic acid 1 mg daily, Uloric 80 mg once daily, aspirin 81 mg daily, Os-Cal, vitamin D one tablet daily, stool softener daily, methotrexate 2.5 mg 3 tablets weekly, Lantus 10 units b.i.d., metoprolol tartrate 100 mg b.i.d., Lasix 40 mg b.i.d., potassium 20 mEq daily, probiotic daily, ketorolac 10 mg q.6 h. as needed.   PERTINENT STUDIES DONE DURING THE HOSPITAL COURSE: As follows, a chest x-ray done on admission showing multiple right-sided rib fractures including the right 6th, 7th, 8th, 9th and 10th ribs. No evidence of pneumothorax, stable bilateral pleural effusions.   HOSPITAL COURSE: This is a 74 year old female with medical problems as mentioned above, who presented to the hospital after a fall and noted to have multiple rib fractures with intractable pain.   1. Status post fall and right-sided rib fractures. The patient was admitted for pain control after her fall. She was having significant pain on the right side of her chest from her rib fractures. The patient received some IV  Toradol overnight in the hospital and the pain was very well controlled on that. A chest x-ray did not show any evidence of pneumothorax. She was saturating well without oxygen. She was seen by physical therapy who thought she would benefit from home health services, which was arranged for her. The patient was discharged on oral Toradol for her pain control.  2. Chronic systolic/diastolic CHF. The patient did not have any evidence of acute decompensated CHF. She will continue her Lasix, beta blocker and digoxin as stated.  3. Chronic atrial fibrillation. The patient remains rate controlled on her metoprolol and digoxin. She will continue that. She was not on any long-term anticoagulation due to a history of GI bleed and peptic ulcer disease.  4. History of breast cancer status post lumpectomy. The patient follows up with Dr. Oliva Bustard. 5. Recent cellulitis. The patient has finished a course of clindamycin recently. She had no acute lower extremity redness or swelling.  6. Diabetes. The patient was maintained on some Levemir and sliding scale insulin while in the hospital. She will resume her Lantus upon discharge.   CODE STATUS: The patient is a Full Code.   DISPOSITION: She is being discharged home with home health physical therapy services.   TIME SPENT: 40 minutes.    ____________________________ Belia Heman. Verdell Carmine, MD vjs:JT D: 09/07/2013 08:21:48 ET T: 09/07/2013 10:38:20 ET JOB#: [id number]  cc: Belia Heman. Verdell Carmine, MD, <Dictator> Bethena Roys. Ancil Boozer, MD Henreitta Leber MD ELECTRONICALLY SIGNED 09/09/2013 15:54

## 2014-04-24 NOTE — H&P (Signed)
PATIENT NAME:  Patricia Mccoy, SZCZERBA MR#:  [id number] DATE OF BIRTH:  [birth year]  ADMITTING PHYSICIAN: Gladstone Lighter, MD.   PRIMARY CARE PHYSICIAN: Bethena Roys. Sowles, MD.   CHIEF COMPLAINT: Chest pain.   HISTORY OF PRESENT ILLNESS: Patricia Mccoy is a 74 year old, Caucasian female with past medical history significant for chronic diastolic CHF, who was just in the hospital last week for CHF exacerbation, CKD stage 4, anemia, Afib, hypertension, diabetes, peripheral vascular disease. She presents to the hospital from [facility] secondary to right-sided chest pain that started after a fall 2 days ago. The patient says that 2 days ago, one night she was walking on the floor. She tripped on her dog's toy and fell onto her right side. She said it was a pretty heavy fall. She hit the right side. She ached all over the next day, but since last night she found that she could not deep breaths. Her pain was so bad that it could not be controlled with oral pain medications, so she went to the urgent care today. X-ray there revealed about 5 fractures on the right side from right 6th, 7th, 8th, 9th and 10th ribs and bilateral pleural effusions with some atelectasis. Because her pain was not controlled with oral pain medications and she is an elderly with at risk developed of pneumonia from multiple rib fractures, she is being admitted under observation. Her labs are not drawn yet. She has otherwise been doing fine since her discharge.   PAST MEDICAL HISTORY:  1. Chronic diastolic CHF. Echo done last week showing EF of 55%.  2. CKD stage 4.  3. Anemia of chronic disease.  4. Diabetes mellitus.  5. Osteoarthritis.  6. Chronic Afib.  7. History of breast cancer status post lumpectomy, chemotherapy and radiation.  8. History of bursitis.  9. Peripheral vascular disease.  10. Osteoporosis.  11. Gout. 12. Peptic ulcer disease.  13. Gastroesophageal reflux disease.   PAST SURGICAL HISTORY: 1. Femoral stent  placement for PVD.  2. Renal artery stent placement.  3. Right lumpectomy with lymph node dissection.   ALLERGIES TO MEDICATIONS: AMBIEN, CODEINE, CONTRAST DYE, KEFLEX, MAXZIDE, MORPHINE, PENICILLIN, SULFA DRUGS AND ZESTRIL.   HOME MEDICATIONS: The patient did not bring her list, but she was discharged on the following medications 4 days ago: 1. Norvasc 5 mg p.o. b.i.d.  2. Omeprazole 20 mg p.o. b.i.d.  3. Mirtazapine 15 mg p.o. daily.  4. PreserVision 1 capsule p.o. b.i.d.  5. Klonopin 0.5 mg p.o. at bedtime.  6. Digoxin 125 mcg p.o. daily.  7. Folic acid 1 mg p.o. daily.  8. Uloric 80 mg p.o. daily.  9. Aspirin 81 mg p.o. daily.  10. Os-Cal 500 mg p.o. daily.  11. Stool softener 100 mg Colace p.o. b.i.d.  12. Methotrexate 7.5 mg once a week in the morning.  13. Bactroban 2% ointment topically for 10 days.  14. Lantus 10 units subcutaneous b.i.d.  15. Metoprolol 100 mg p.o. b.i.d.  16. Lasix 40 mg p.o. b.i.d.  17. Potassium chloride 20 mEq p.o. daily.  18. Clindamycin 300 mg p.o. 3 times a day for 4 more days.  19. Probiotic capsule 1 capsule daily while on Clindamycin.   SOCIAL HISTORY: Lives at home by herself. No smoking or alcohol use. Quit smoking more than 30 years ago.   FAMILY HISTORY: Significant for a mom with coronary artery disease and also a stroke.   REVIEW OF SYSTEMS:  CONSTITUTIONAL: No fever, fatigue or weakness.  EYES:  No blurred vision, double vision, inflammation or glaucoma. Uses glasses.  ENT: No tinnitus, ear pain, hearing loss, epistaxis or discharge.  RESPIRATORY: No cough, wheeze, hemoptysis or COPD.  CARDIOVASCULAR: Positive for right-sided pleuritic chest pain since the fall. No orthopnea, edema, arrhythmia, palpitations.  GASTROINTESTINAL: No nausea, vomiting, diarrhea, abdominal pain, hematemesis or melena.  GENITOURINARY: No dysuria, hematuria, renal calculus, frequency or incontinence.  ENDOCRINE: No polyuria, nocturia, thyroid problems, heat or  cold intolerance.  HEMATOLOGY: No anemia, easy bruising or bleeding.  SKIN: No acne, rash or lesions.  MUSCULOSKELETAL: No neck, back, shoulder pain, arthritis or gout.  NEUROLOGIC: No numbness, weakness, CVA, TIA or seizures.  PSYCHOLOGICAL: No anxiety, insomnia, depression.   PHYSICAL EXAMINATION: VITAL SIGNS: Temperature 97.5 degrees Fahrenheit, pulse 64, respirations 16, blood pressure 155/70, pulse oximetry 96% on room air.  GENERAL: A well-nourished female sitting in bed, not in any acute distress.  HEENT: Normocephalic, atraumatic. Pupils equal, round, reacting to light. Anicteric sclerae. Extraocular movements intact. Oropharynx is clear without erythema, mass or exudates.  NECK: Supple. No thyromegaly, JVD or carotid bruits. No lymphadenopathy.  LUNGS: She has decreased bibasilar breath sounds and limitation to taking deep breaths at this time, but no wheeze or crackles heard. No accessory muscles used for breathing.  CARDIOVASCULAR: S1, S2. Regular rate and rhythm. A 2/6 systolic murmur heard. No rubs or gallops.  ABDOMEN: Soft, nontender, nondistended. No hepatosplenomegaly. Normal bowel sounds.  EXTREMITIES: No pedal edema. No clubbing or cyanosis, 2+ dorsalis pedis pulses palpable bilaterally.  SKIN: No acne, rash or lesions.  LYMPHATICS: No cervical or inguinal lymphadenopathy.  NEUROLOGIC: Cranial nerves intact. No focal motor or sensory deficits.  PSYCHOLOGICAL: The patient is awake, alert, oriented x 3.   LABORATORY DATA: Pending at this time.  DIAGNOSTIC DATA: A right-sided rib x-ray which shows multiple right-sided rib fractures from the right 6th, 7th, 8th, 9th and 10th ribs.  No pneumothorax noted. Hazy opacities at right base are stable and small bilateral pleural effusions with chronic basilar changes which are stable are present.   ASSESSMENT AND PLAN: A 74 year old female with a history of chronic diastolic congestive heart failure, cellulitis, chronic kidney  disease, anemia, diabetes and peripheral vascular disease, comes after a fall and right-sided rib fractures, admitted for pain control under observation.  1. Fall and right 6th through 10th rib fractures. Admitted under observation for pain control. Toradol p.r.n. as patient is allergic to narcotics. Tylenol too. If her renal function is worse, we will limit the Toradol to only 2 or 3 doses and hopefully try to control her with oral pain medications. Incentive spirometry recommended. Chest x-ray showing stable pleural effusions. No pneumonia noted. O2 support if needed.  2. Chronic diastolic congestive heart failure, well compensated. Recent echocardiogram with normal ejection fraction. Continue her Lasix. Change to daily for today. No pedal edema at this time.  3. Atrial fibrillation, rate controlled. She is actually in normal sinus rhythm on telemetry. Not on anticoagulation likely from peptic ulcer disease history. She is on aspirin. Will continue on metoprolol and digoxin. Can follow up with Dr. Nehemiah Massed as recommended.  4. Hypertension. Continue home medications.  5. Breast cancer status post lumpectomy. Follows with Dr. Oliva Bustard. Also finished chemotherapy radiation. Appears stable.  6. Recent cellulitis on clindamycin, resolved symptoms. Likely can be stopped at the time of discharge as she will finish 7 days.  7. Deep vein thrombosis prophylaxis with subcutaneous heparin.   CODE STATUS: Of the patient is full code.   TIME  SPENT ON ADMISSION: 50 minutes.     ____________________________ Gladstone Lighter, MD rk:TT D: 09/05/2013 15:06:34 ET T: 09/05/2013 15:51:55 ET JOB#: [id number]  cc: Gladstone Lighter, MD, <Dictator> Corey Skains, MD Bethena Roys. Ancil Boozer, MD Gladstone Lighter MD ELECTRONICALLY SIGNED 09/07/2013 17:18

## 2014-04-24 NOTE — Consult Note (Signed)
Chief Complaint:  Subjective/Chief Complaint Shortness of breath much improved patient feels more energy and is ready to go home   VITAL SIGNS/ANCILLARY NOTES: **Vital Signs.:   04-Oct-15 04:00  Vital Signs Type Routine  Temperature Temperature (F) 98.2  Celsius 36.7  Temperature Source oral  Pulse Pulse 70  Respirations Respirations 20  Systolic BP Systolic BP 858  Diastolic BP (mmHg) Diastolic BP (mmHg) 61  Mean BP 99  Pulse Ox % Pulse Ox % 91  Pulse Ox Activity Level  At rest  Oxygen Delivery Room Air/ 21 %  *Intake and Output.:   04-Oct-15 08:00  Grand Totals Intake:  240 Output:      Net:  240 24 Hr.:  240  Oral Intake      In:  240  Unmeasured Intake  Few bites   Brief Assessment:  GEN well developed, well nourished, no acute distress   Cardiac Regular  murmur present  -- carotid bruits  -- JVD   Respiratory normal resp effort  clear BS   Gastrointestinal Normal   Gastrointestinal details normal Soft  Nontender  Nondistended  No masses palpable  Bowel sounds normal   EXTR negative cyanosis/clubbing, negative edema   Lab Results: Routine Chem:  03-Oct-15 06:07   Glucose, Serum  317  BUN  33  Creatinine (comp)  1.98  Sodium, Serum  131  Potassium, Serum 3.8  Chloride, Serum  95  CO2, Serum 27  Calcium (Total), Serum  7.7  Anion Gap 9 (Result(s) reported on 03 Oct 2013 at 06:54AM.)  Osmolality (calc) 282  Routine Hem:  03-Oct-15 06:07   WBC (CBC) 6.9  RBC (CBC)  3.01  Hemoglobin (CBC)  9.8  Hematocrit (CBC)  29.2  Platelet Count (CBC) 261  MCV 97  MCH 32.6  MCHC 33.6  RDW 14.5  Neutrophil % 70.9  Lymphocyte % 16.9  Monocyte % 8.4  Eosinophil % 3.0  Basophil % 0.8  Neutrophil # 4.9  Lymphocyte # 1.2  Monocyte # 0.6  Eosinophil # 0.2  Basophil # 0.1 (Result(s) reported on 03 Oct 2013 at 06:54AM.)   Radiology Results: XRay:    01-Oct-15 01:56, Chest Portable Single View  Chest Portable Single View   REASON FOR EXAM:    Chest  pain  COMMENTS:       PROCEDURE: DXR - DXR PORTABLE CHEST SINGLE VIEW  - Oct 01 2013  1:56AM     CLINICAL DATA:  Shortness of breath, history of CHF    EXAM:  PORTABLE CHEST - 1 VIEW    COMPARISON:  09/05/2013    FINDINGS:  Cardiomegaly with mild interstitial edema. Moderate left and small  right pleural effusions. Associated bilateral lower lobe opacities,  likely atelectasis. No pneumothorax.     IMPRESSION:  Cardiomegaly with mild interstitial edema.    Moderate left and small right pleural effusions.    Associated bilateral lower lobe opacities, likely atelectasis.      Electronically Signed    By: Julian Hy M.D.    On: 10/01/2013 02:00       Verified By: Julian Hy, M.D.,    01-Oct-15 16:36, Chest 1 View for Post Thoracentesis  Chest 1 View for Post Thoracentesis   REASON FOR EXAM:    post thoracentesis  COMMENTS:       PROCEDURE: DXR - DXR CHEST 1 VIEW POST [facility]  - Oct 01 2013  4:36PM     CLINICAL DATA:  Post left-sided thoracentesis  EXAM:  CHEST XRAY 1 VIEW    COMPARISON:  Earlier same day ; 09/05/2013; chest CT - earlier same  day    FINDINGS:  Grossly unchanged cardiac silhouette and mediastinal contours with  atherosclerotic plaque within the thoracic aorta. No change to  minimal reduction in residual small likely partially loculated  left-sided effusion post thoracentesis. Unchanged small right-sided  pleural effusion. Grossly unchanged slightly asymmetric biapical  pleural parenchymal thickening, left greater than right. No  pneumothorax. Pulmonary vasculature remains indistinct with  cephalization of flow. Minimally improved aeration of lung bases  with persistent bibasilar opacities, left greater than right. No new  focal airspace opacities. Unchanged bones. Surgical clips overlie  the right axilla.     IMPRESSION:  1. Minimal reductionin persistent small likely partially loculated  left-sided effusion post thoracentesis.  No pneumothorax.  2. Unchanged small right-sided pleural effusion  3. Minimally improved aeration of lungs with persistent bibasilar  opacities, left greater thanright - atelectasis versus infiltrate.  4. Similar findings of suspected mild pulmonary edema superimposed  on advanced emphysematous change as was better demonstrated on chest  CT performed earlier same day.      Electronically Signed    By: Sandi Mariscal M.D.    On: 10/01/2013 16:42         Verified By: Aileen Fass, M.D.,  [place]:    01-Oct-15 16:42, US Guide Thoracentesis Left  US Guide Thoracentesis Left   REASON FOR EXAM:    plueral effusion, to be done by Dr Pascal Lux  COMMENTS:       PROCEDURE: [place]  - US GUIDED THORACENTESIS LEFT  - Oct 01 2013  4:42PM     INDICATION:  Remote history of fall, now with small bilateral pleural effusions.  Please perform ultrasound-guided thoracentesis for therapeutic and  diagnostic purposes.    EXAM:  ULTRASOUND GUIDED THORACENTESIS    COMPARISON:  Chest radiograph - earlier same day; chest CT- earlier  same day  MEDICATIONS:  None    COMPLICATIONS:  None immediate    TECHNIQUE:  Informed written consent was obtained from the patient after a  discussion of the risks, benefits and alternatives to treatment. A  timeout was performed prior to the initiation of the procedure.    Preprocedural ultrasound scanning demonstrated a small left and only  a trace right-sided pleural effusion. As such, the decision was made  to perform ultrasound-guided left-sided thoracentesis. The left  lower chest was prepped and draped in the usual sterile fashion. 1%  lidocaine was used for local anesthesia.    An ultrasound image was saved for documentation purposes. An 8 Fr  Safe-T-Centesis catheter was introduced. The thoracentesis was  performed. The catheter was removed and a dressing was applied. The  patient tolerated the procedure well without immediate post  procedural complication. The  patient was escorted to have an upright  chest radiograph.    FINDINGS:  A total of approximately 175 cc of serous fluid was removed.  Requested samples were sent to the laboratory.     IMPRESSION:  1. Successful ultrasound-guided left sided thoracentesis yielding  175 cc of pleural fluid.  2. Trace right-sided pleural effusion, too small for percutaneous  sampling.      Electronically Signed    By: Sandi Mariscal M.D.    On: 10/01/2013 16:46         Verified By: Aileen Fass, M.D.,  Cardiology:    01-Oct-15 01:41, ED ECG  Ventricular Rate 98  Atrial Rate 98  P-R Interval 164  QRS Duration 84  QT 342  QTc 436  P Axis 25  R Axis 51  T Axis -8  ECG interpretation   Normal sinus rhythm  T wave abnormality, consider inferior ischemia  Abnormal ECG  When compared with ECG of 30-Aug-2013 04:55,  No significant change was found  ----------unconfirmed----------  Confirmed by OVERREAD, NOT ([id number]), editor PEARSON, BARBARA ([id number]) on 10/01/2013 8:32:05 AM  ED ECG   CT:    01-Oct-15 02:56, CT Chest Without Contrast  CT Chest Without Contrast   REASON FOR EXAM:    dyspnea, hypoxia  COMMENTS:       PROCEDURE: CT  - CT CHEST WITHOUT CONTRAST  - Oct 01 2013  2:56AM     CLINICAL DATA:  Shortness of breath.  Hypoxia.    EXAM:  CT CHEST WITHOUT CONTRAST    TECHNIQUE:  Multidetector CT imaging of the chest was performed following the  standard protocol without IV contrast..    COMPARISON:  08/30/2013  FINDINGS:  Mild cardiac enlargement. Coronary artery calcifications. Diffuse  calcification of the thoracic aorta. No aortic aneurysm. Mild  prominence of mediastinal lymph nodes with a pretracheal node  measuring 13 mm diameter. This is probably reactive. Esophagus is  mostly decompressed. Moderate size bilateral pleural effusions.  Pleural thickening and calcification in the apices. This islikely  postinflammatory and stable since previous study. Evaluation of  lungs is limited  due to respiratory motion artifact but there  appears to be diffuse emphysematous change. Interstitial changes  probably representing edema. Atelectasis or consolidation in the  lung bases bilaterally.    Visualized portions of the upper abdominal organs are grossly  unremarkable except for extensive vascular calcifications.   IMPRESSION:  Cardiac enlargement with probable interstitial pulmonary edema.  Diffuse emphysematous changes in the lungs. Atelectasis or  consolidation in the lung bases. Postinflammatory changes in the  lung apices.      Electronically Signed    By: Lucienne Capers M.D.    On: 10/01/2013 03:32         Verified By: Neale Burly, M.D.,   Assessment/Plan:  Assessment/Plan:  Assessment IMP  dyspnea  hypoxemia  pleural effusion  congestive heart failure  shortness of breath  hypertension  renal insufficiency  diabetes .   Plan PLAN  agree with discharge home today  supplemental oxygen therapy  continue blood pressure support  continue chronic renal insufficiency follow-up with Nephrology as necessary  diabetes continue current medications  status post thoracentesis on the right  continue diuretic therapy  increase activity  for follow-up cardiology once 2 weeks   Electronic Signatures: Lujean Amel D (MD)  (Signed 05-Oct-15 13:29)  Authored: Chief Complaint, VITAL SIGNS/ANCILLARY NOTES, Brief Assessment, Lab Results, Radiology Results, Assessment/Plan   Last Updated: 05-Oct-15 13:29 by Lujean Amel D (MD)

## 2014-04-24 NOTE — Consult Note (Signed)
Chief Complaint:  Subjective/Chief Complaint feeling a bit better but still tired - converted to NSR overnight and BPs improved daughter at bedside   VITAL SIGNS/ANCILLARY NOTES: **Vital Signs.:   10-Dec-15 10:00  Pulse Pulse 68  Respirations Respirations 24  Systolic BP Systolic BP 952  Diastolic BP (mmHg) Diastolic BP (mmHg) 57  Mean BP 78  Pulse Ox % Pulse Ox % 98  Pulse Ox Activity Level  At rest  Oxygen Delivery Room Air/ 21 %  *Intake and Output.:   Shift 10-Dec-15 07:00  Length of Stay Totals Intake:  225 Output:  850    Net:  -625    Daily 07:00  Length of Stay Totals Intake:  225 Output:  850    Net:  -625    Shift 15:00  Length of Stay Totals Intake:  255 Output:  1150    Net:  -895   Brief Assessment:  GEN thin, frail   Cardiac Regular  -- LE edema  --Rub  --Gallop   Respiratory normal resp effort  wheezing  rhonchi  crackles  crackles in bases, rhonchi iproved   Gastrointestinal details normal Soft   EXTR negative cyanosis/clubbing, RUE edema from lymphedema   Additional Physical Exam awake, alert, conversant   Lab Results: Routine Chem:  09-Dec-15 03:21   Glucose, Serum  245  BUN  87  Creatinine (comp)  4.70  Sodium, Serum  129  Potassium, Serum  3.3  Chloride, Serum  91  CO2, Serum 27  Calcium (Total), Serum  8.1  Anion Gap 11  Osmolality (calc) 294  eGFR (African American)  12  eGFR (Non-African American)  10 (eGFR values <18m/min/1.73 m2 may be an indication of chronic kidney disease (CKD). Calculated eGFR, using the MRDR Study equation, is useful in  patients with stable renal function. The eGFR calculation will not be reliable in acutely ill patients when serum creatinine is changing rapidly. It is not useful in patients on dialysis. The eGFR calculation may not be applicable to patients at the low and high extremes of body sizes, pregnant women, and vegetarians.)  Cholesterol, Serum 108  Triglycerides, Serum 134  HDL (INHOUSE)   32  VLDL Cholesterol Calculated 27  LDL Cholesterol Calculated 49 (Result(s) reported on 09 Dec 2013 at 04:18AM.)  Routine Hem:  09-Dec-15 03:21   WBC (CBC) 8.0  RBC (CBC)  3.04  Hemoglobin (CBC)  9.6  Hematocrit (CBC)  29.4  Platelet Count (CBC) 254  MCV 97  MCH 31.5  MCHC 32.6  RDW  15.6  Neutrophil % 83.3  Lymphocyte % 11.2  Monocyte % 2.9  Eosinophil % 2.3  Basophil % 0.3  Neutrophil #  6.6  Lymphocyte #  0.9  Monocyte # 0.2  Eosinophil # 0.2  Basophil # 0.0 (Result(s) reported on 09 Dec 2013 at 04:15AM.)   Assessment/Plan:  Assessment/Plan:  Assessment 89yoF with HTN, DM, diastolic dysfunction, CKD who is admitted with hypoxia, AoCKD, atrial fibrillation with RVR and hypotension.   Plan **Acute on chronic kidney disease:  Renal function not worse today and UOP is improved with better perfusion pressures since converting to NSR and improved BPs.  There are no indications for dialysis but we continued the discussion and she has decided that she would not pursue dialysis in any situation.  Given her frailty and subacute decline this is an appropriated decision.  She wishes to pursue hospice care.   --Cont lasix for diuresis but if goal is getting home with hospice labs  are not of paramount importance.  --No dialysis now or in future  **diastolic dysfunction with pulmonary edema complicated by AKI above: CXR with pulmonary edema and rales on exam --Agree with continued diuresis  --Limit IVF and sodium intake  **hypervolemic hyponatremia secondary to diastolic dysfunction and resultant volume overload --Diuresis and limit na/fluids (how strictly TBD by goals of care) but no other specific tx  **Anemia of CKD: no indications for epogen.  I will continue to follow but may not see the patient tomorrow if hospice care is priority.  Please page me with questions or concerns. Jannifer Hick MD [phone]   Electronic Signatures: Justin Mend (MD)  (Signed 10-Dec-15  14:31)  Authored: Chief Complaint, VITAL SIGNS/ANCILLARY NOTES, Brief Assessment, Lab Results, Assessment/Plan   Last Updated: 10-Dec-15 14:31 by Justin Mend (MD)

## 2014-04-24 NOTE — Consult Note (Signed)
Present Illness Patient is a 74 year old female with history of multiple medical problems including previous atrial fibrillation, history of diastolic heart failure, history of chronic kidney disease, history of breast carcinoma status post radiation chemotherapy.  She presented to an acute care clinic today after noting she gained 3-4 lb overnight.  She was noted to be in atrial fibrillation with rapid ventricular response.  She also had some mild shortness of breath.  She was given IV Cardizem in emergency room and is converted to normal sinus rhythm.  She is ruled out for myocardial infarction thus far.  She continues to feel mildly short of breath although has diuresed fairly well.   Physical Exam:  GEN no acute distress   HEENT PERRL   NECK supple   RESP normal resp effort  clear BS   CARD Regular rate and rhythm   ABD denies tenderness   LYMPH negative neck, negative axillae   EXTR negative cyanosis/clubbing   SKIN normal to palpation   NEURO cranial nerves intact, motor/sensory function intact   PSYCH A+O to time, place, person   Review of Systems:  Subjective/Chief Complaint Complains of weight gain mild shortness of breath   General: Fatigue   Skin: No Complaints   ENT: No Complaints   Eyes: No Complaints   Neck: No Complaints   Respiratory: Short of breath   Cardiovascular: Palpitations   Gastrointestinal: No Complaints   Genitourinary: No Complaints   Vascular: No Complaints   Musculoskeletal: No Complaints   Neurologic: No Complaints   Hematologic: No Complaints   Endocrine: No Complaints   Psychiatric: No Complaints   Review of Systems: All other systems were reviewed and found to be negative   Medications/Allergies Reviewed Medications/Allergies reviewed   Family & Social History:  Family and Social History:  Family History Non-Contributory   Social History negative tobacco   EKG:  EKG NSR    Codeine: Alt Ment Status  Sulfa  drugs: Other  PCN: Other  Zestril: Unknown  Keflex: Unknown  Maxzide: Unknown  Morphine: Alt Ment Status  Ambien: Unknown  Contrast dye: Unknown   Impression 74 year old female with history of atrial fibrillation in the past, history of diabetes, a history of breast carcinoma who was admitted with complaints of weight gain overnight.  She has a history of diastolic heart failure with preserved left ventricular function by echocardiogram in the recent past.  She was noted to be in atrial fibrillation with fairly rapid ventricular response.  She was given Cardizem and converted back to normal sinus rhythm which she can continues to be an since admission.  She is ruled out for a myocardial infarction and feels somewhat better.  Chest x-ray shows bilateral small pleural effusions with no pulmonary edema.  Would continue to carefully diurese with furosemide.  She remains on metoprolol and is currently in sinus rhythm.  Would continue with this regimen for now as it appears to be controlling her.  Diuresis should improve her maintenance of sinus rhythm.  She is somewhat frail at a fall risk and therefore would not proceed with chronic anticoagulation at present.   Plan 1. Continue with current medications 2. Carefully diurese as the patient has acute on chronic diastolic heart failure in [place] Heart Association class 3. 3. Would avoid chronic anticoagulation at present as the patient appears to be a somewhat fall risk and somewhat frail 4. low-sodium diet 5. Daily weights at home with additional diuretic should her weight go up 3 lb overnight 6.  Would a ambulated morning and closely follow for evidence of improvement and consideration for discharge if stable. 7. Will follow with you   Electronic Signatures: Teodoro Spray (MD)  (Signed [phone] 20:17)  Authored: General Aspect/Present Illness, History and Physical Exam, Review of System, Family & Social History, EKG , Allergies,  Impression/Plan   Last Updated: 18-Nov-15 20:17 by Teodoro Spray (MD)

## 2015-07-25 IMAGING — CT CT CHEST W/O CM
2 of 4 series · 15 of 36 positions shown, 18 images · non-contrast
Comparison: None.

CLINICAL DATA: Pleural effusion. Known history of breast cancer and
weight loss. Worsening lower extremity edema. Allergy to iodinated
contrast material.

EXAM:
CT CHEST WITHOUT CONTRAST
TECHNIQUE: Multidetector CT imaging of the chest was performed following the
standard protocol without IV contrast..

[Series 2: routine chest wo · axial · 0.58mm/px · z∈[-715,-400]mm · 12 of 75 slices shown, 15 images]
[im 6/75  mediastinal]
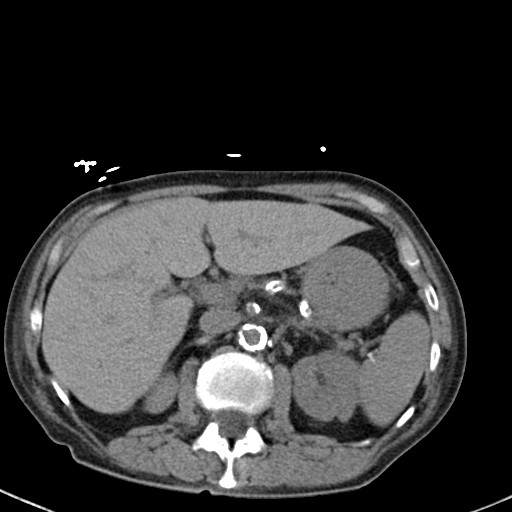
[im 6/75  lung]
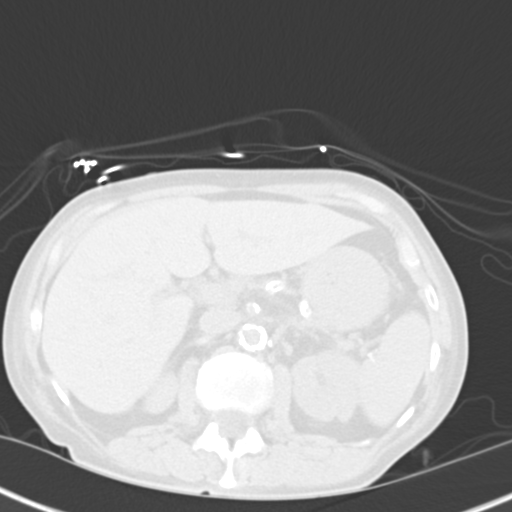
[im 12/75  lung]
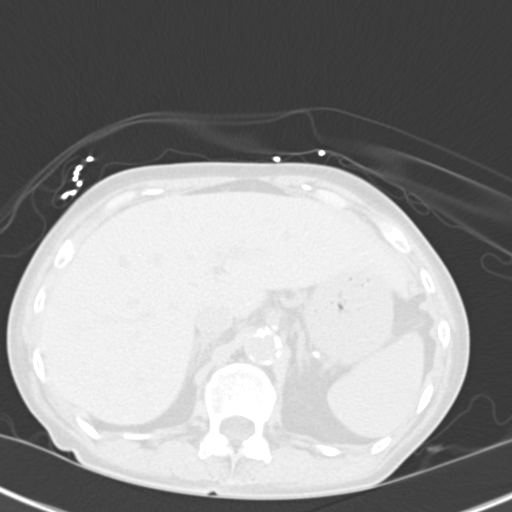
[im 18/75  lung]
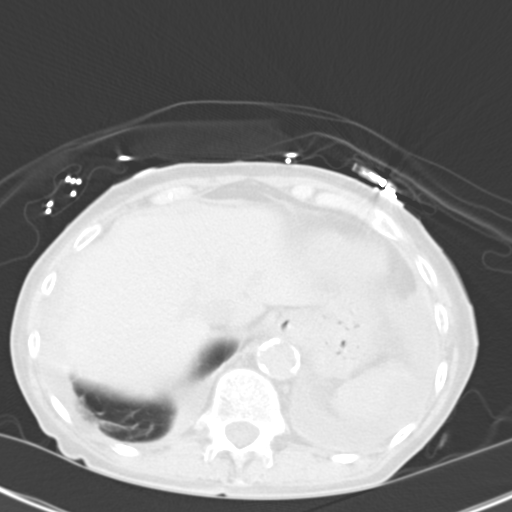
[im 23/75  lung]
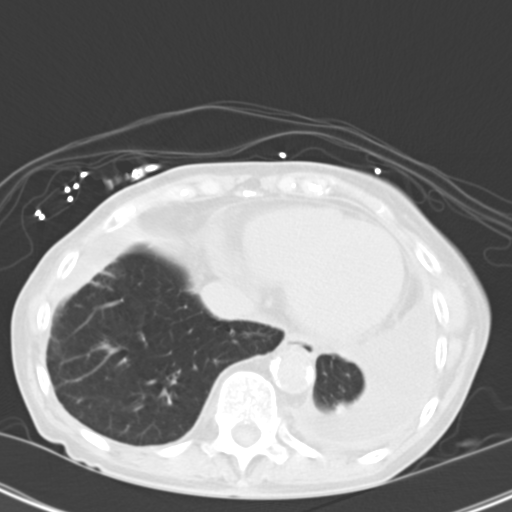
[im 29/75  mediastinal]
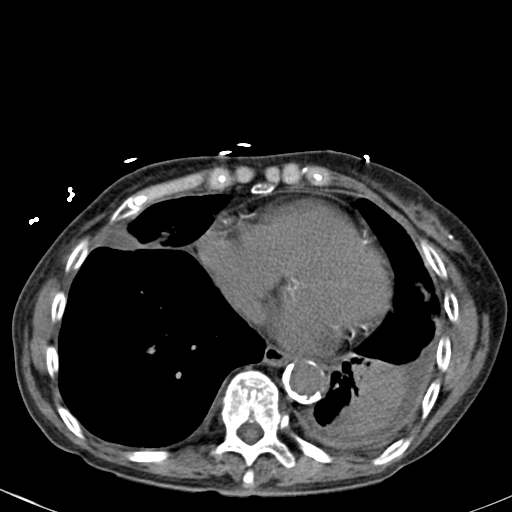
[im 29/75  lung]
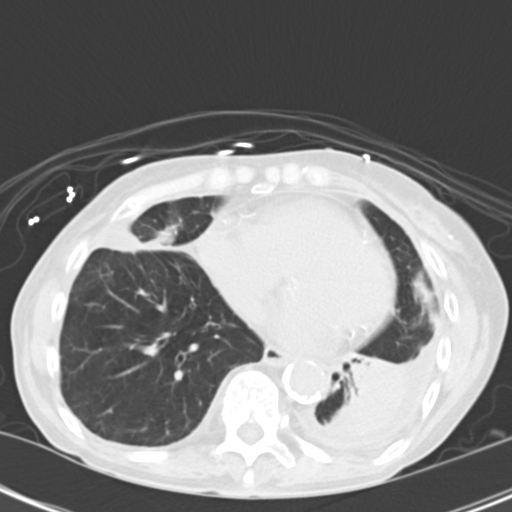
[im 35/75  lung]
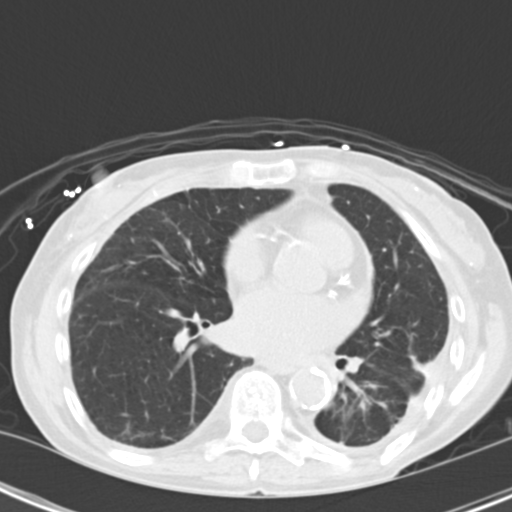
[im 40/75  lung]
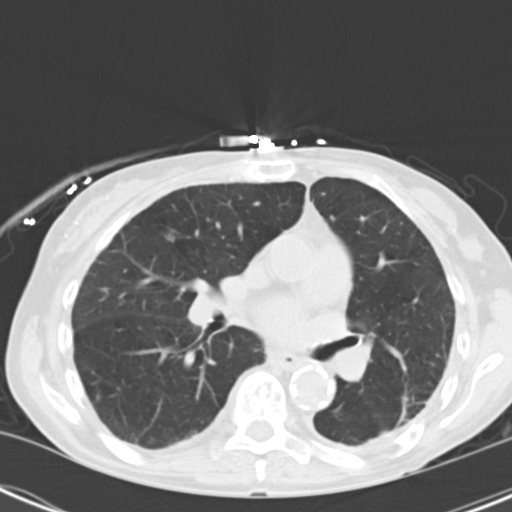
[im 46/75  lung]
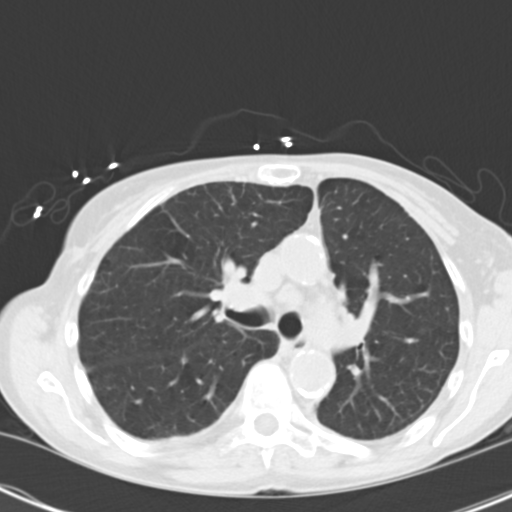
[im 52/75  mediastinal]
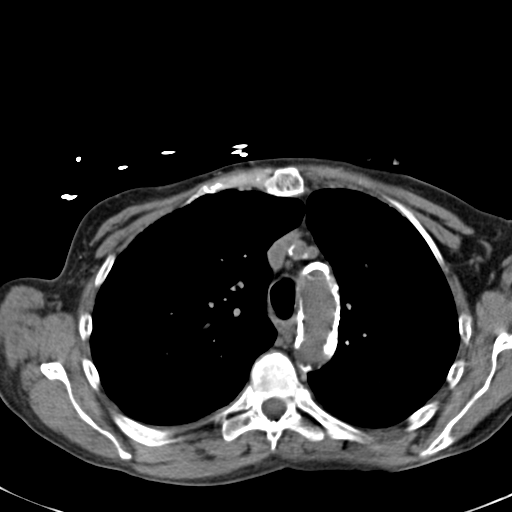
[im 52/75  lung]
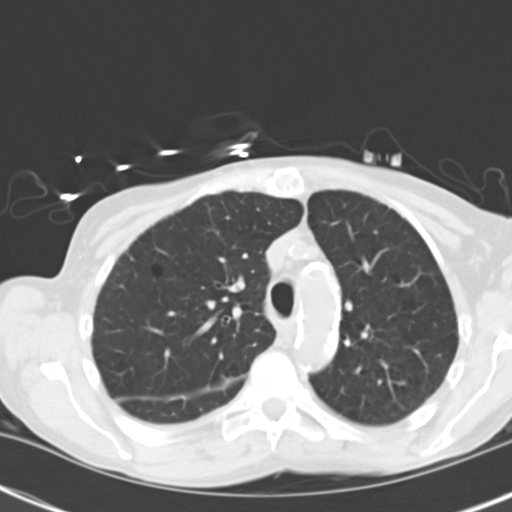
[im 57/75  lung]
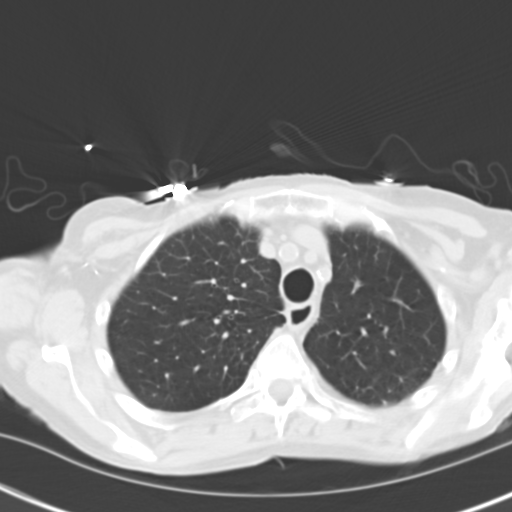
[im 63/75  lung]
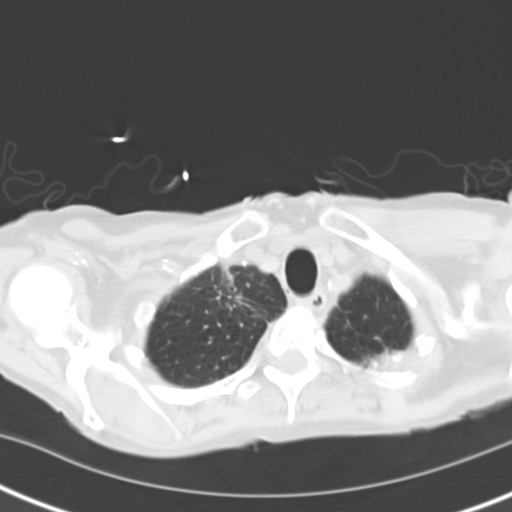
[im 69/75  lung]
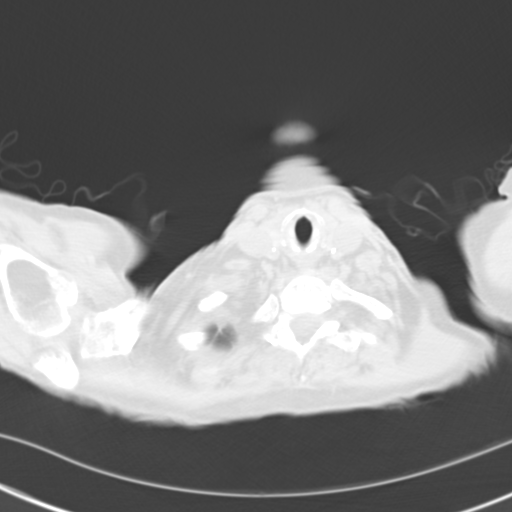

[Series 5: cor routine chest wo · coronal · 0.62mm/px · 3 of 126 slices shown]
[im 26/126  lung]
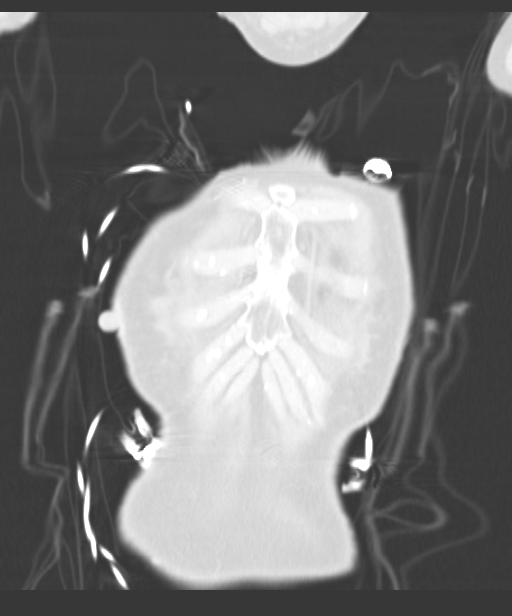
[im 51/126  lung]
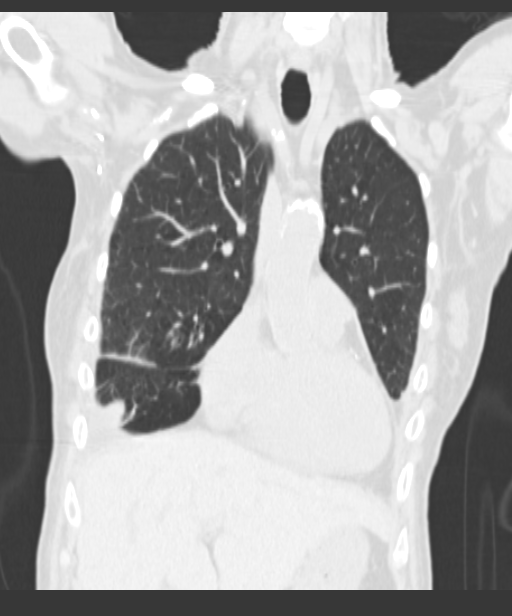
[im 76/126  lung]
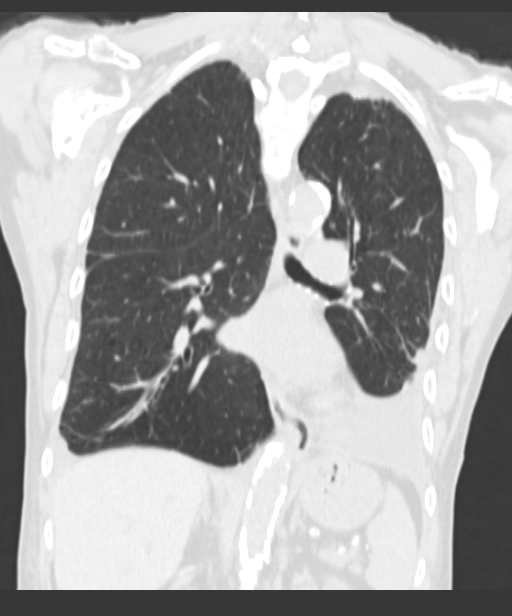

[15 of 36 positions shown; findings below may reference images not displayed]

FINDINGS: Bilateral pleural effusions, greater on the left. Consolidation or
atelectasis in the left lung base. Air bronchograms are present. No
distinct mass lesion is appreciated. Emphysematous changes in the
lungs. Calcification and scarring in the right apex measuring 1.8 cm
diameter. This is probably postinflammatory. Less prominent pleural
calcification and scarring on the left apex. No pneumothorax.

Normal heart size. Normal caliber thoracic aorta. Calcification in
the thoracic aorta, great vessels, and coronary arteries. Esophagus
is decompressed. No evidence of significant lymphadenopathy in the
chest on unenhanced imaging.

Visualized portions of the upper abdominal organs demonstrate
prominent aortic and vascular calcifications. Small esophageal
hiatal hernia. Diffuse right renal atrophy.

Normal alignment of the thoracic spine. No destructive bone lesions
are appreciated.
IMPRESSION: No definite evidence of metastatic disease. Bilateral pleural
effusions and basilar atelectasis/ consolidation, greater on the
left. Extensive vascular calcifications. Emphysematous changes in
the lungs. Scarring and calcifications in the lung apices
bilaterally, likely postinflammatory.

## 2015-08-26 IMAGING — CR DG CHEST 1V PORT
1 series · 1 of 1 positions shown · non-contrast
Comparison: 09/05/2013

CLINICAL DATA: Shortness of breath, history of CHF

EXAM:
PORTABLE CHEST - 1 VIEW

[ap]
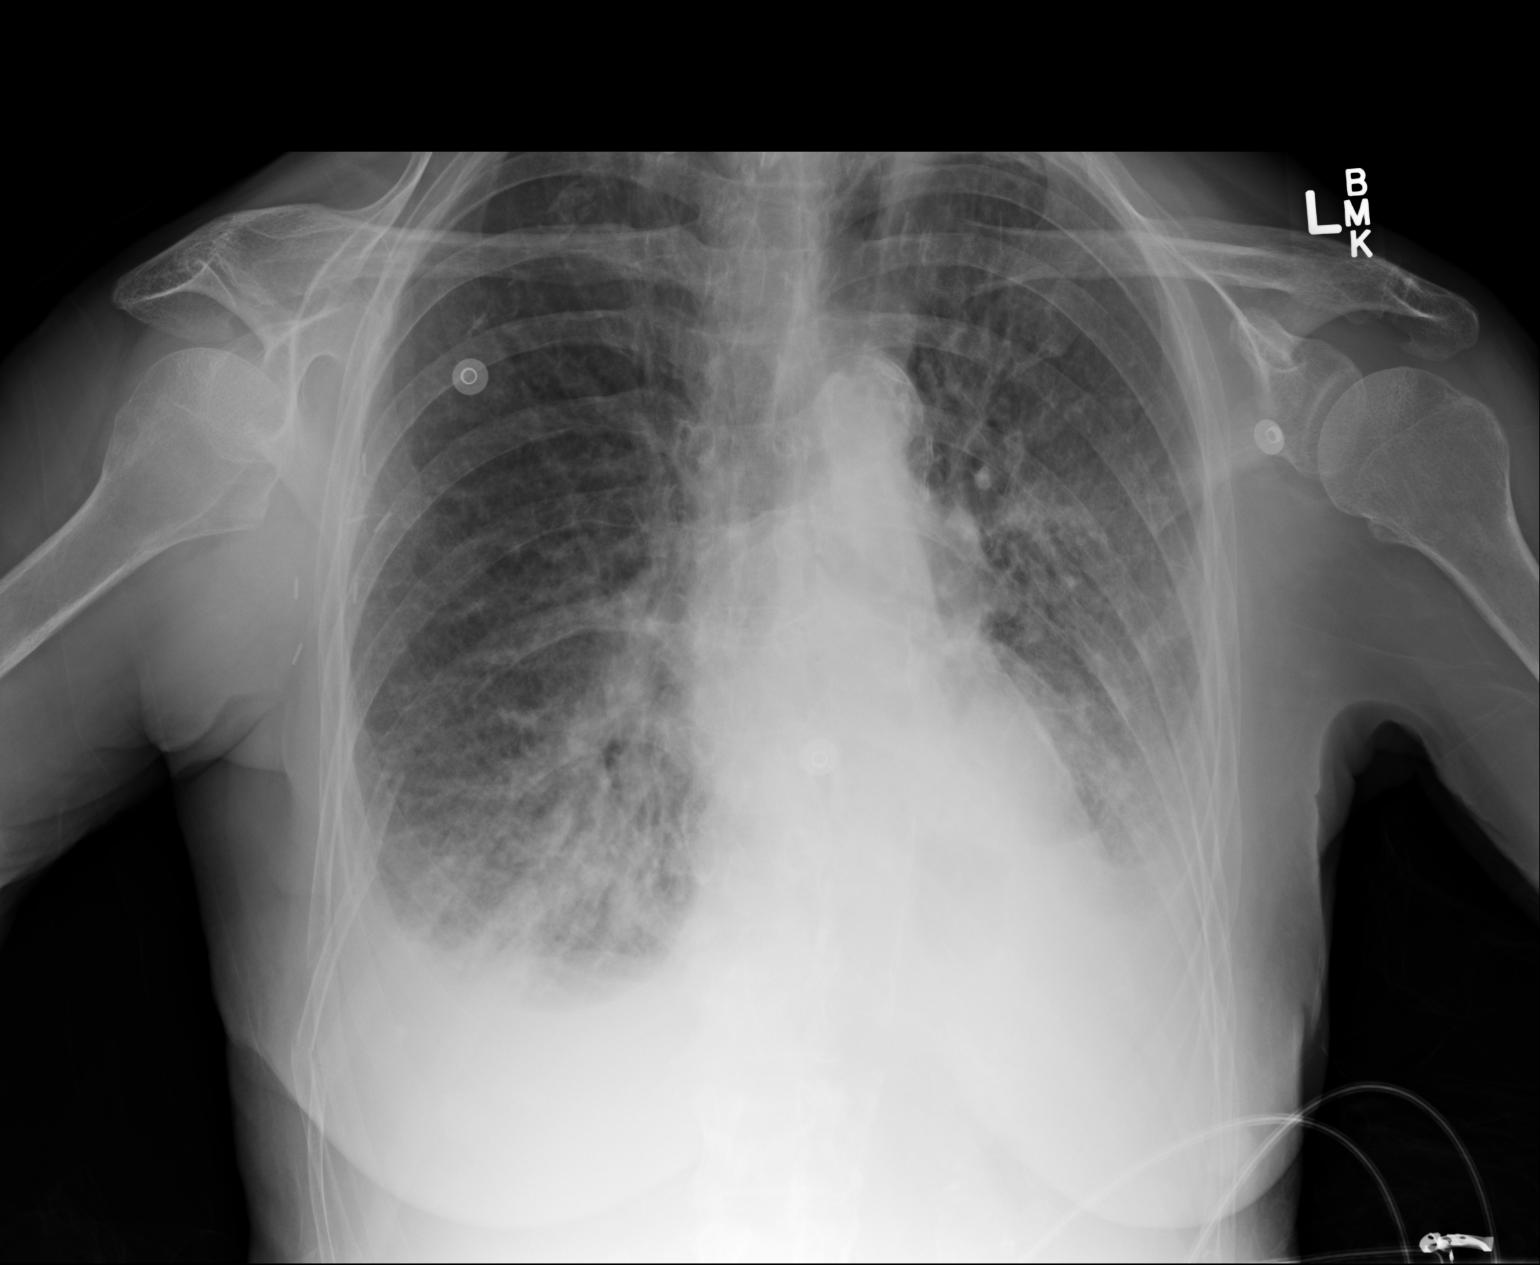

[1 of 1 positions shown; findings below may reference images not displayed]

FINDINGS: Cardiomegaly with mild interstitial edema. Moderate left and small
right pleural effusions. Associated bilateral lower lobe opacities,
likely atelectasis. No pneumothorax.
IMPRESSION: Cardiomegaly with mild interstitial edema.

Moderate left and small right pleural effusions.

Associated bilateral lower lobe opacities, likely atelectasis.

## 2015-11-02 IMAGING — CR DG CHEST 1V PORT
1 series · 1 of 1 positions shown · non-contrast
Comparison: 11/18/2013

CLINICAL DATA: Short of breath

EXAM:
PORTABLE CHEST - 1 VIEW

[ap]
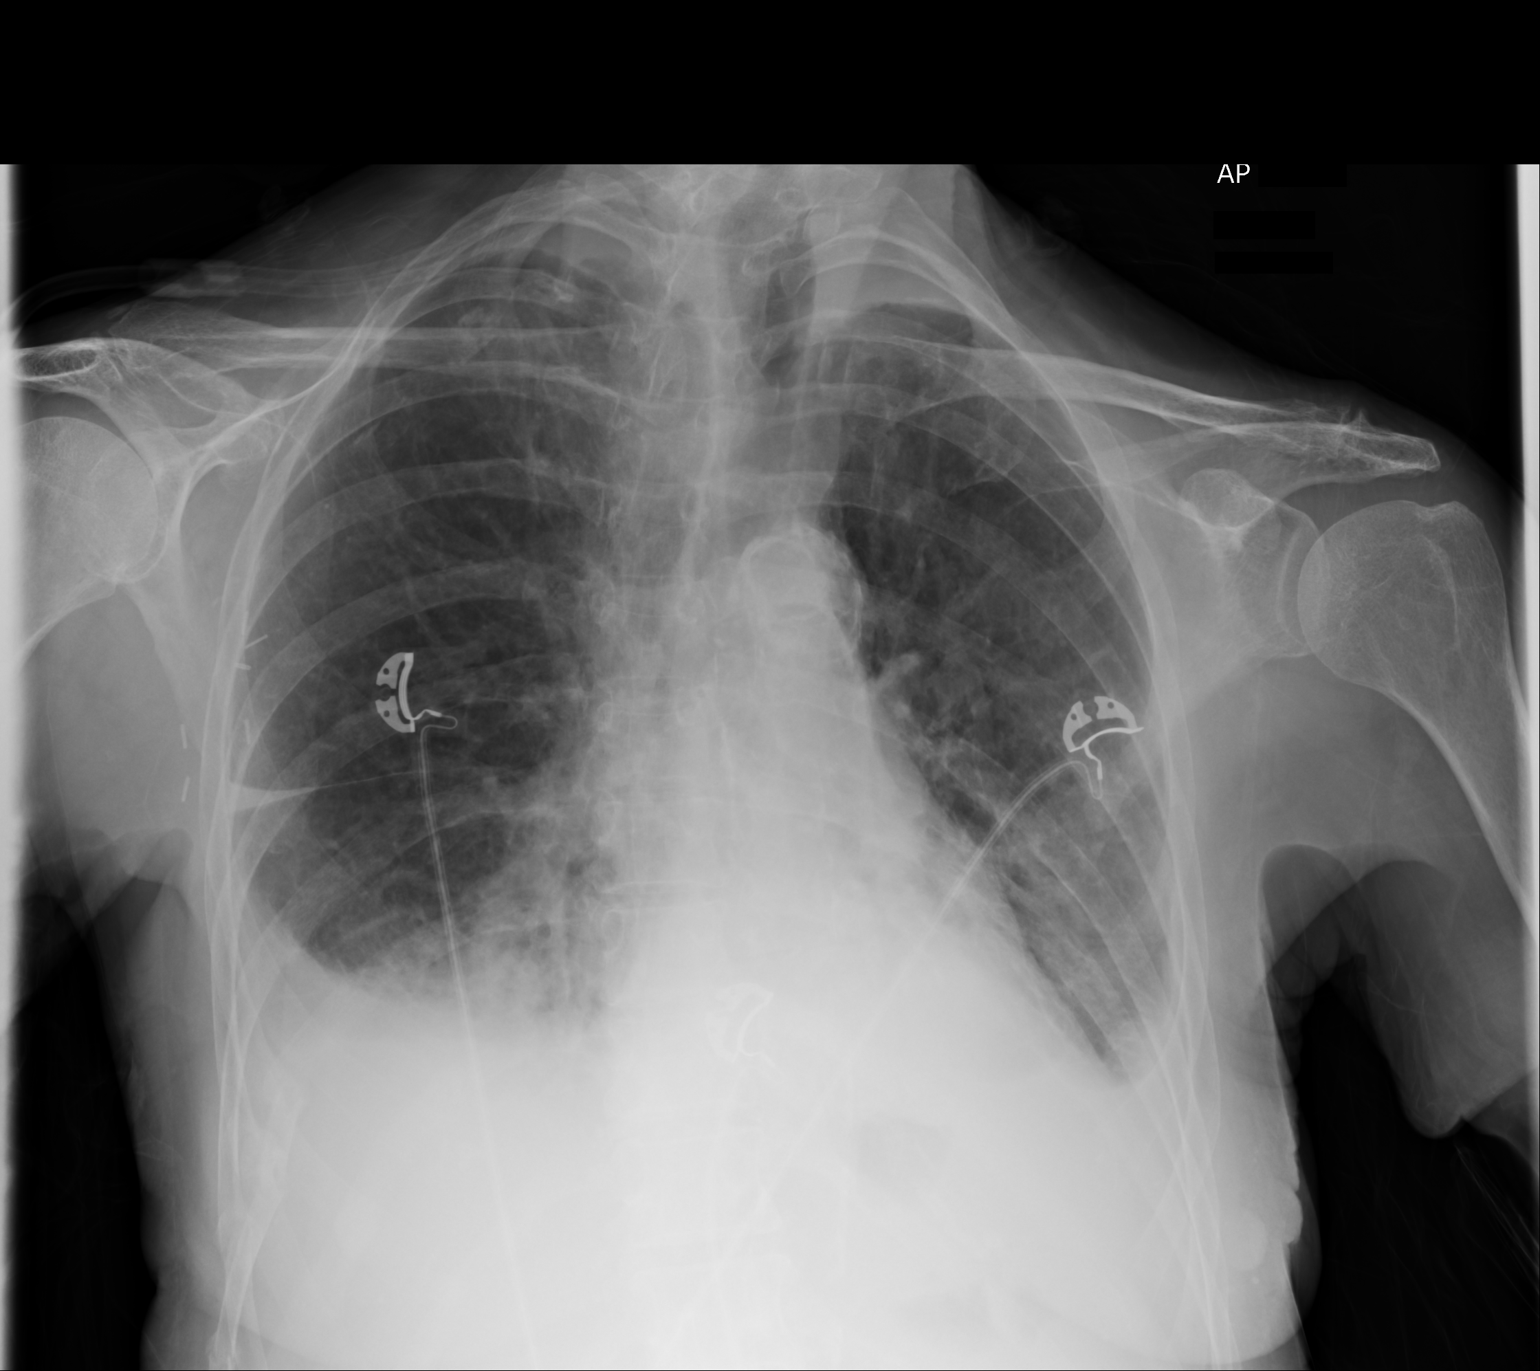

[1 of 1 positions shown; findings below may reference images not displayed]

FINDINGS: Interval progression of bilateral effusions and compressive
atelectasis in the lung bases. Pulmonary vascular congestion also
has developed since the prior study.

COPD with hyperinflation and apical scarring bilaterally.
IMPRESSION: COPD. Congestive heart failure with vascular congestion and
progression of bilateral pleural effusions.
